# Patient Record
Sex: Female | Born: 1965 | Race: Asian | Hispanic: No | Marital: Married | Smoking: Never smoker
Health system: Southern US, Community
[De-identification: ages and names within clinical notes are randomized; demographics above are authoritative.]

## PROBLEM LIST (undated history)

## (undated) DIAGNOSIS — F5104 Psychophysiologic insomnia: Secondary | ICD-10-CM

## (undated) DIAGNOSIS — I2510 Atherosclerotic heart disease of native coronary artery without angina pectoris: Secondary | ICD-10-CM

## (undated) DIAGNOSIS — R7303 Prediabetes: Secondary | ICD-10-CM

## (undated) DIAGNOSIS — K30 Functional dyspepsia: Secondary | ICD-10-CM

## (undated) DIAGNOSIS — E785 Hyperlipidemia, unspecified: Secondary | ICD-10-CM

## (undated) DIAGNOSIS — K219 Gastro-esophageal reflux disease without esophagitis: Secondary | ICD-10-CM

## (undated) DIAGNOSIS — Z860101 Personal history of adenomatous and serrated colon polyps: Secondary | ICD-10-CM

## (undated) HISTORY — DX: Functional dyspepsia: K30

## (undated) HISTORY — DX: Psychophysiologic insomnia: F51.04

## (undated) HISTORY — DX: Personal history of adenomatous and serrated colon polyps: Z86.0101

## (undated) HISTORY — DX: Hyperlipidemia, unspecified: E78.5

## (undated) HISTORY — PX: TUBAL LIGATION: SHX77

## (undated) HISTORY — DX: Prediabetes: R73.03

## (undated) HISTORY — DX: Gastro-esophageal reflux disease without esophagitis: K21.9

---

## 2013-03-07 ENCOUNTER — Encounter (HOSPITAL_COMMUNITY): Payer: Self-pay | Admitting: Emergency Medicine

## 2013-03-07 ENCOUNTER — Emergency Department (HOSPITAL_COMMUNITY)
Admission: EM | Admit: 2013-03-07 | Discharge: 2013-03-07 | Disposition: A | Discharge status: Home / Self Care | Payer: No Typology Code available for payment source | Attending: Emergency Medicine | Admitting: Emergency Medicine

## 2013-03-07 DIAGNOSIS — Y9389 Activity, other specified: Secondary | ICD-10-CM | POA: Insufficient documentation

## 2013-03-07 DIAGNOSIS — S069X9A Unspecified intracranial injury with loss of consciousness of unspecified duration, initial encounter: Secondary | ICD-10-CM | POA: Insufficient documentation

## 2013-03-07 DIAGNOSIS — Y92410 Unspecified street and highway as the place of occurrence of the external cause: Secondary | ICD-10-CM | POA: Insufficient documentation

## 2013-03-07 DIAGNOSIS — S069XAA Unspecified intracranial injury with loss of consciousness status unknown, initial encounter: Secondary | ICD-10-CM | POA: Insufficient documentation

## 2013-03-07 DIAGNOSIS — I2510 Atherosclerotic heart disease of native coronary artery without angina pectoris: Secondary | ICD-10-CM | POA: Insufficient documentation

## 2013-03-07 HISTORY — DX: Atherosclerotic heart disease of native coronary artery without angina pectoris: I25.10

## 2013-03-07 MED ORDER — IBUPROFEN 600 MG PO TABS: 600.0000 mg | ORAL_TABLET | Freq: Four times a day (QID) | ORAL | Status: DC | PRN

## 2013-03-07 NOTE — ED Notes (Signed)
PA at bedside, language line interpreting.

## 2013-03-07 NOTE — Discharge Instructions (Signed)
Call for a follow up appointment with a Family or Primary Care Provider.  °Return if Symptoms worsen.   °Take medication as prescribed.  ° °
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
## 2013-03-11 NOTE — ED Provider Notes (Signed)
Medical screening examination/treatment/procedure(s) were performed by non-physician practitioner and as supervising physician I was immediately available for consultation/collaboration.  EKG Interpretation   None         Raygen Dahm L Vandy Tsuchiya, MD 03/11/13 0720 

## 2014-03-15 ENCOUNTER — Ambulatory Visit: Payer: Self-pay | Attending: Internal Medicine

## 2014-06-12 ENCOUNTER — Encounter (HOSPITAL_COMMUNITY): Payer: Self-pay | Admitting: *Deleted

## 2014-06-12 ENCOUNTER — Emergency Department (INDEPENDENT_AMBULATORY_CARE_PROVIDER_SITE_OTHER): Payer: No Typology Code available for payment source

## 2014-06-12 ENCOUNTER — Emergency Department (INDEPENDENT_AMBULATORY_CARE_PROVIDER_SITE_OTHER)
Admission: EM | Admit: 2014-06-12 | Discharge: 2014-06-12 | Disposition: A | Discharge status: Home / Self Care | Payer: No Typology Code available for payment source | Source: Home / Self Care | Attending: Family Medicine | Admitting: Family Medicine

## 2014-06-12 DIAGNOSIS — R1032 Left lower quadrant pain: Secondary | ICD-10-CM

## 2014-06-12 LAB — POCT URINALYSIS DIP (DEVICE)
Bilirubin Urine: NEGATIVE
Glucose, UA: NEGATIVE mg/dL
KETONES UR: NEGATIVE mg/dL
LEUKOCYTES UA: NEGATIVE
Nitrite: NEGATIVE
PH: 7 (ref 5.0–8.0)
Protein, ur: NEGATIVE mg/dL
SPECIFIC GRAVITY, URINE: 1.015 (ref 1.005–1.030)
Urobilinogen, UA: 0.2 mg/dL (ref 0.0–1.0)

## 2014-06-12 LAB — POCT PREGNANCY, URINE: PREG TEST UR: NEGATIVE

## 2014-06-12 MED ORDER — GI COCKTAIL ~~LOC~~
ORAL | Status: AC
Start: 2014-06-12 — End: 2014-06-12
  Filled 2014-06-12: qty 30

## 2014-06-12 MED ORDER — OMEPRAZOLE 40 MG PO CPDR
40.0000 mg | DELAYED_RELEASE_CAPSULE | Freq: Every day | ORAL | Status: DC
Start: 2014-06-12 — End: 2014-12-26

## 2014-06-12 MED ORDER — GI COCKTAIL ~~LOC~~
30.0000 mL | Freq: Once | ORAL | Status: AC
  Administered 2014-06-12: 30 mL via ORAL

## 2014-06-12 NOTE — Discharge Instructions (Signed)
Thank you for coming in today. If your belly pain worsens, or you have high fever, bad vomiting, blood in your stool or black tarry stool go to the Emergency Room.   Gastritis, Adult Gastritis is soreness and puffiness (inflammation) of the lining of the stomach. If you do not get help, gastritis can cause bleeding and sores (ulcers) in the stomach. HOME CARE   Only take medicine as told by your doctor.  If you were given antibiotic medicines, take them as told. Finish the medicines even if you start to feel better.  Drink enough fluids to keep your pee (urine) clear or pale yellow.  Avoid foods and drinks that make your problems worse. Foods you may want to avoid include:  Caffeine or alcohol.  Chocolate.  Mint.  Garlic and onions.  Spicy foods.  Citrus fruits, including oranges, lemons, or limes.  Food containing tomatoes, including sauce, chili, salsa, and pizza.  Fried and fatty foods.  Eat small meals throughout the day instead of large meals. GET HELP RIGHT AWAY IF:   You have black or dark red poop (stools).  You throw up (vomit) blood. It may look like coffee grounds.  You cannot keep fluids down.  Your belly (abdominal) pain gets worse.  You have a fever.  You do not feel better after 1 week.  You have any other questions or concerns. MAKE SURE YOU:   Understand these instructions.  Will watch your condition.  Will get help right away if you are not doing well or get worse. Document Released: 06/27/2007 Document Revised: 04/02/2011 Document Reviewed: 02/21/2011 [facility] Patient Information 2015 [place], [place]. This information is not intended to replace advice given to you by your health care provider. Make sure you discuss any questions you have with your health care provider.

## 2014-06-12 NOTE — ED Provider Notes (Signed)
Julia Watts is a 49 y.o. female who presents to Urgent Care today for abdominal pain. Patient has 2 day history of abdominal pain. Pain is located in the left side of the abdomen and seems to be worse with food. No fevers nausea vomiting diarrhea or vaginal bleeding or vaginal discharge. She has a bowel movement daily. No chest pains or palpitations. She has not tried any medications yet. She has a history of gastritis in the past.   History reviewed. No pertinent past medical history. History reviewed. No pertinent past surgical history. History  Substance Use Topics  . Smoking status: Not on file  . Smokeless tobacco: Not on file  . Alcohol Use: Not on file   ROS as above Medications: No current facility-administered medications for this encounter.   Current Outpatient Prescriptions  Medication Sig Dispense Refill  . omeprazole (PRILOSEC) 40 MG capsule Take 1 capsule (40 mg total) by mouth daily. 30 capsule 1   Allergies not on file   Exam:  BP 130/113 mmHg  Pulse 74  Temp(Src) 98.1 F (36.7 C) (Oral)  Resp 18  SpO2 100%  LMP  (LMP Unknown) Gen: Well NAD HEENT: EOMI,  MMM Lungs: Normal work of breathing. CTABL Heart: RRR no MRG Abd: NABS, Soft. Nondistended, mildly tender left abdomen without rebound or guarding. Exts: Brisk capillary refill, warm and well perfused.   Patient was given a GI cocktail and felt completely better.  Results for orders placed or performed during the hospital encounter of 06/12/14 (from the past 24 hour(s))  Pregnancy, urine POC     Status: None   Collection Time: 06/12/14  4:39 PM  Result Value Ref Range   Preg Test, Ur NEGATIVE NEGATIVE  POCT urinalysis dip (device)     Status: Abnormal   Collection Time: 06/12/14  4:40 PM  Result Value Ref Range   Glucose, UA NEGATIVE NEGATIVE mg/dL   Bilirubin Urine NEGATIVE NEGATIVE   Ketones, ur NEGATIVE NEGATIVE mg/dL   Specific Gravity, Urine 1.015 1.005 - 1.030   Hgb urine dipstick TRACE (A)  NEGATIVE   pH 7.0 5.0 - 8.0   Protein, ur NEGATIVE NEGATIVE mg/dL   Urobilinogen, UA 0.2 0.0 - 1.0 mg/dL   Nitrite NEGATIVE NEGATIVE   Leukocytes, UA NEGATIVE NEGATIVE   Dg Abd 1 View  06/12/2014   CLINICAL DATA:  Acute onset of left-sided abdominal pain for 2 days. Initial encounter.  EXAM: ABDOMEN - 1 VIEW  COMPARISON:  None.  FINDINGS: The visualized bowel gas pattern is unremarkable. Scattered air and stool filled loops of colon are seen; no abnormal dilatation of small bowel loops is seen to suggest small bowel obstruction. No free intra-abdominal air is identified, though evaluation for free air is limited on a single supine view.  The visualized osseous structures are within normal limits; the sacroiliac joints are unremarkable in appearance.  IMPRESSION: Unremarkable bowel gas pattern; no free intra-abdominal air seen. Small to moderate amount of stool noted in the colon.   Electronically Signed   By: Garald Balding M.D.   On: 06/12/2014 17:32    Assessment and Plan: 49 y.o. female with abdominal pain likely gastritis based on history of previous episodes of gastritis and improvement with GI cocktail. Treat with omeprazole and follow-up with primary care provider. Return as needed.  Discussed warning signs or symptoms. Please see discharge instructions. Patient expresses understanding.     Gregor Hams, MD 06/12/14 [phone]

## 2014-06-12 NOTE — ED Notes (Signed)
Pt  Reports  Low  abd  Pain     X   Several  Days          No     Diarrhea            Symptoms        Any bleeding   denys  Any  Discharge           History    Of     Gastritis               Husband in  Interpreting

## 2014-09-09 ENCOUNTER — Emergency Department (INDEPENDENT_AMBULATORY_CARE_PROVIDER_SITE_OTHER)
Admission: EM | Admit: 2014-09-09 | Discharge: 2014-09-09 | Disposition: A | Discharge status: Home / Self Care | Payer: Self-pay | Source: Home / Self Care | Attending: Family Medicine | Admitting: Family Medicine

## 2014-09-09 ENCOUNTER — Encounter (HOSPITAL_COMMUNITY): Payer: Self-pay | Admitting: Emergency Medicine

## 2014-09-09 DIAGNOSIS — H527 Unspecified disorder of refraction: Secondary | ICD-10-CM

## 2014-09-09 DIAGNOSIS — H538 Other visual disturbances: Secondary | ICD-10-CM

## 2014-09-09 NOTE — ED Notes (Signed)
Here for vision change States she is not able to see things near States she would like a pair of glasses

## 2014-09-09 NOTE — ED Provider Notes (Signed)
CSN: [id number]     Arrival date & time 09/09/14  1714 History   First MD Initiated Contact with Patient 09/09/14 1827     Chief Complaint  Patient presents with  . Visual Field Change   (Consider location/radiation/quality/duration/timing/severity/associated sxs/prior Treatment) HPI Comments: 49 year old Asian female complaining of blurred vision for 2-3 months. She states her vision has been deteriorating gradually over time. It is difficult for her to see both faraway and up close for reading. Her visual acuity test was 20/50 in each eye and both eyes. She denies eye pain. Denies eye injury.   Past Medical History  Diagnosis Date  . Coronary artery disease    History reviewed. No pertinent past surgical history. History reviewed. No pertinent family history. Social History  Substance Use Topics  . Smoking status: Never Smoker   . Smokeless tobacco: None  . Alcohol Use: None   OB History    No data available     Review of Systems  Constitutional: Negative.   HENT: Negative.   Eyes: Positive for visual disturbance. Negative for photophobia, pain, discharge, redness and itching.    Allergies  Review of patient's allergies indicates no known allergies.  Home Medications   Prior to Admission medications   Medication Sig Start Date End Date Taking? Authorizing Provider  ibuprofen (ADVIL,MOTRIN) 600 MG tablet Take 1 tablet (600 mg total) by mouth every 6 (six) hours as needed. Take with meals 03/07/13   Harvie Heck, PA-C   BP 110/89 mmHg  Pulse 72  Temp(Src) 97.4 F (36.3 C) (Oral)  Resp 16  SpO2 99% Physical Exam  Constitutional: She is oriented to person, place, and time. She appears well-developed and well-nourished. No distress.  HENT:  Head: Normocephalic and atraumatic.  Mouth/Throat: Oropharynx is clear and moist. No oropharyngeal exudate.  Eyes: Conjunctivae and EOM are normal. Pupils are equal, round, and reactive to light.  Funduscopic exam reveals sharp  disks. No hemorrhages. No visual field deficits  Neck: Normal range of motion. Neck supple.  Cardiovascular: Normal rate.   Pulmonary/Chest: No respiratory distress.  Neurological: She is alert and oriented to person, place, and time. She exhibits normal muscle tone.  Skin: Skin is warm and dry.  Psychiatric: She has a normal mood and affect.  Nursing note and vitals reviewed.   ED Course  Procedures (including critical care time) Labs Review Labs Reviewed - No data to display  Imaging Review No results found.   MDM   1. Blurred vision, bilateral   2. Refraction error    Follow with optometrist of choice    Janne Napoleon, NP 09/09/14 [phone]

## 2014-09-09 NOTE — Discharge Instructions (Signed)
Blurred Vision You have been seen today complaining of blurred vision. This means you have a loss of ability to see small details.  CAUSES  Blurred vision can be a symptom of underlying eye problems, such as:  Aging of the eye (presbyopia).  Glaucoma.  Cataracts.  Eye infection.  Eye-related migraine.  Diabetes mellitus.  Fatigue.  Migraine headaches.  High blood pressure.  Breakdown of the back of the eye (macular degeneration).  Problems caused by some medications. The most common cause of blurred vision is the need for eyeglasses or a new prescription. Today in the emergency department, no cause for your blurred vision can be found. SYMPTOMS  Blurred vision is the loss of visual sharpness and detail (acuity). DIAGNOSIS  Should blurred vision continue, you should see your caregiver. If your caregiver is your primary care physician, he or she may choose to refer you to another specialist.  TREATMENT  Do not ignore your blurred vision. Make sure to have it checked out to see if further treatment or referral is necessary. SEEK MEDICAL CARE IF:  You are unable to get into a specialist so we can help you with a referral. SEEK IMMEDIATE MEDICAL CARE IF: You have severe eye pain, severe headache, or sudden loss of vision. MAKE SURE YOU:   Understand these instructions.  Will watch your condition.  Will get help right away if you are not doing well or get worse. Document Released: 01/11/2003 Document Revised: 04/02/2011 Document Reviewed: 08/13/2007 [facility] Patient Information 2015 [place], [place]. This information is not intended to replace advice given to you by your health care provider. Make sure you discuss any questions you have with your health care provider.  Visual Disturbances You have had a disturbance in your vision. This may be caused by various conditions, such as:  Migraines. Migraine headaches are often preceded by a disturbance in vision. Blind spots or  light flashes are followed by a headache. This type of visual disturbance is temporary. It does not damage the eye.  Glaucoma. This is caused by increased pressure in the eye. Symptoms include haziness, blurred vision, or seeing rainbow colored circles when looking at bright lights. Partial or complete visual loss can occur. You may or may not experience eye pain. Visual loss may be gradual or sudden and is irreversible. Glaucoma is the leading cause of blindness.  Retina problems. Vision will be reduced if the retina becomes detached or if there is a circulation problem as with diabetes, high blood pressure, or a mini-stroke. Symptoms include seeing "floaters," flashes of light, or shadows, as if a curtain has fallen over your eye.  Optic nerve problems. The main nerve in your eye can be damaged by redness, soreness, and swelling (inflammation), poor circulation, drugs, and toxins. It is very important to have a complete exam done by a specialist to determine the exact cause of your eye problem. The specialist may recommend medicines or surgery, depending on the cause of the problem. This can help prevent further loss of vision or reduce the risk of having a stroke. Contact the caregiver to whom you have been referred and arrange for follow-up care right away. SEEK IMMEDIATE MEDICAL CARE IF:   Your vision gets worse.  You develop severe headaches.  You have any weakness or numbness in the face, arms, or legs.  You have any trouble speaking or walking. Document Released: 02/16/2004 Document Revised: 04/02/2011 Document Reviewed: 06/08/2009 [facility] Patient Information 2015 [place], [place]. This information is not intended to  replace advice given to you by your health care provider. Make sure you discuss any questions you have with your health care provider. ° °

## 2014-10-19 ENCOUNTER — Ambulatory Visit: Payer: Self-pay | Attending: Internal Medicine

## 2014-10-20 ENCOUNTER — Encounter (HOSPITAL_COMMUNITY): Payer: Self-pay | Admitting: Emergency Medicine

## 2014-12-26 ENCOUNTER — Emergency Department (INDEPENDENT_AMBULATORY_CARE_PROVIDER_SITE_OTHER)
Admission: EM | Admit: 2014-12-26 | Discharge: 2014-12-26 | Disposition: A | Discharge status: Home / Self Care | Payer: No Typology Code available for payment source | Source: Home / Self Care

## 2014-12-26 ENCOUNTER — Encounter (HOSPITAL_COMMUNITY): Payer: Self-pay | Admitting: Emergency Medicine

## 2014-12-26 DIAGNOSIS — R1111 Vomiting without nausea: Secondary | ICD-10-CM

## 2014-12-26 LAB — POCT URINALYSIS DIP (DEVICE)
Bilirubin Urine: NEGATIVE
Glucose, UA: NEGATIVE mg/dL
KETONES UR: NEGATIVE mg/dL
LEUKOCYTES UA: NEGATIVE
NITRITE: NEGATIVE
PH: 6.5 (ref 5.0–8.0)
Protein, ur: NEGATIVE mg/dL
Specific Gravity, Urine: <=1.005 (ref 1.005–1.030)
Urobilinogen, UA: 0.2 mg/dL (ref 0.0–1.0)

## 2014-12-26 MED ORDER — OMEPRAZOLE 40 MG PO CPDR: 40.0000 mg | DELAYED_RELEASE_CAPSULE | Freq: Every day | ORAL | Status: DC

## 2014-12-26 NOTE — ED Provider Notes (Signed)
CSN: [id number]     Arrival date & time 12/26/14  1300 History   None    Chief Complaint  Patient presents with  . Emesis   (Consider location/radiation/quality/duration/timing/severity/associated sxs/prior Treatment) HPI 49 year old female with history of vomiting daily for the last 2 months. States that she vomits whenever she eats or drinks. She states that she has been seen in the urgent care several months ago for this similar problem and was given Prilosec which did help her symptoms but she is out of that medication at this time. She states that she does not have insurance to go to a doctor thus she comes here. States she has a bowel movement daily. She has not had any weight loss. No weakness. She denies pregnancy. There is been no change in sexual partners. (Husband is in the room when she answers this) Past Medical History  Diagnosis Date  . Coronary artery disease    History reviewed. No pertinent past surgical history. No family history on file. Social History  Substance Use Topics  . Smoking status: Never Smoker   . Smokeless tobacco: None  . Alcohol Use: No   OB History    Gravida Para Term Preterm AB TAB SAB Ectopic Multiple Living   0 0 0 0 0 0 0 0       Review of Systems Positive for vomiting Denies fever, chills, blood in stool. Allergies  Review of patient's allergies indicates no known allergies.  Home Medications   Prior to Admission medications   Medication Sig Start Date End Date Taking? Authorizing Provider  ibuprofen (ADVIL,MOTRIN) 600 MG tablet Take 1 tablet (600 mg total) by mouth every 6 (six) hours as needed. Take with meals 03/07/13   Harvie Heck, PA-C  omeprazole (PRILOSEC) 40 MG capsule Take 1 capsule (40 mg total) by mouth daily. 12/26/14   Konrad Felix, PA   Meds Ordered and Administered this Visit  Medications - No data to display  LMP  (LMP Unknown) Orthostatic VS for the past 24 hrs:  BP- Lying Pulse- Lying BP- Sitting Pulse-  Sitting  12/26/14 1331 122/78 mmHg 65 108/79 mmHg 62    Physical Exam  Constitutional: She is oriented to person, place, and time. She appears well-developed and well-nourished.  Neck: Normal range of motion. Neck supple.  Cardiovascular: Normal rate and normal heart sounds.   Pulmonary/Chest: Effort normal and breath sounds normal.  Abdominal: Soft. Bowel sounds are normal. She exhibits no distension and no mass. There is no tenderness. There is no rebound and no guarding.  Musculoskeletal: Normal range of motion.  Neurological: She is alert and oriented to person, place, and time.  Skin: Skin is warm and dry.  Patient has good skin turgor  Nursing note and vitals reviewed.   ED Course  Procedures (including critical care time)  Labs Review Labs Reviewed  POCT URINALYSIS DIP (DEVICE) - Abnormal; Notable for the following:    Hgb urine dipstick TRACE (*)    All other components within normal limits    Imaging Review No results found.   Visual Acuity Review  Right Eye Distance:   Left Eye Distance:   Bilateral Distance:    Right Eye Near:   Left Eye Near:    Bilateral Near:         MDM   1. Non-intractable vomiting without nausea, vomiting of unspecified type    there is no indication for emergent investigations at this time. Patient does request a new prescription for  Prilosec which has been given to her she is also asked for a oral gastric enterology. Paperwork for [employer] [facility] services has been provided to her. She is advised to call and set up an appointment. I have advised patient that I find nothing that is untoward at this time is not losing weight, and is making stool. She is advised to return if there are new or worsening of symptoms. She is also advised that long-term vomiting tenderness probably beyond the ability of the urgent care and therefore she really needs to see a specialist.    Konrad Felix, [place] 12/26/14 [phone]

## 2014-12-26 NOTE — Discharge Instructions (Signed)
Nausea and Vomiting  Nausea means you feel sick to your stomach. Throwing up (vomiting) is a reflex where stomach contents come out of your mouth.  HOME CARE   · Take medicine as told by your doctor.  · Do not force yourself to eat. However, you do need to drink fluids.  · If you feel like eating, eat a normal diet as told by your doctor.    Eat rice, wheat, potatoes, bread, lean meats, yogurt, fruits, and vegetables.    Avoid high-fat foods.  · Drink enough fluids to keep your pee (urine) clear or pale yellow.  · Ask your doctor how to replace body fluid losses (rehydrate). Signs of body fluid loss (dehydration) include:    Feeling very thirsty.    Dry lips and mouth.    Feeling dizzy.    Dark pee.    Peeing less than normal.    Feeling confused.    Fast breathing or heart rate.  GET HELP RIGHT AWAY IF:   · You have blood in your throw up.  · You have black or bloody poop (stool).  · You have a bad headache or stiff neck.  · You feel confused.  · You have bad belly (abdominal) pain.  · You have chest pain or trouble breathing.  · You do not pee at least once every 8 hours.  · You have cold, clammy skin.  · You keep throwing up after 24 to 48 hours.  · You have a fever.  MAKE SURE YOU:   · Understand these instructions.  · Will watch your condition.  · Will get help right away if you are not doing well or get worse.     This information is not intended to replace advice given to you by your health care provider. Make sure you discuss any questions you have with your health care provider.     Document Released: 06/27/2007 Document Revised: 04/02/2011 Document Reviewed: 06/09/2010  Elsevier Interactive Patient Education ©2016 [employer]

## 2014-12-26 NOTE — ED Notes (Signed)
Pt here with constant emesis with food/fluids x 2 months Pt was here for same sx;s and prescribed anti-acid without relief Left LQ pain, non radiating  Orthostats negative

## 2015-08-15 ENCOUNTER — Ambulatory Visit (HOSPITAL_COMMUNITY)
Admission: RE | Admit: 2015-08-15 | Discharge: 2015-08-15 | Disposition: A | Discharge status: Home / Self Care | Payer: No Typology Code available for payment source | Source: Ambulatory Visit | Attending: Family Medicine | Admitting: Family Medicine

## 2015-08-15 ENCOUNTER — Encounter: Payer: Self-pay | Admitting: Family Medicine

## 2015-08-15 ENCOUNTER — Ambulatory Visit (INDEPENDENT_AMBULATORY_CARE_PROVIDER_SITE_OTHER): Payer: No Typology Code available for payment source | Admitting: Family Medicine

## 2015-08-15 VITALS — BP 81/55 | HR 57 | Temp 97.7°F | Ht 61.0 in | Wt 124.0 lb

## 2015-08-15 DIAGNOSIS — Z0000 Encounter for general adult medical examination without abnormal findings: Secondary | ICD-10-CM

## 2015-08-15 DIAGNOSIS — M25572 Pain in left ankle and joints of left foot: Secondary | ICD-10-CM

## 2015-08-15 LAB — COMPLETE METABOLIC PANEL WITH GFR
ALBUMIN: 4.6 g/dL (ref 3.6–5.1)
ALK PHOS: 60 U/L (ref 33–130)
ALT: 15 U/L (ref 6–29)
AST: 21 U/L (ref 10–35)
BUN: 12 mg/dL (ref 7–25)
CALCIUM: 9.5 mg/dL (ref 8.6–10.4)
CO2: 27 mmol/L (ref 20–31)
Chloride: 103 mmol/L (ref 98–110)
Creat: 0.71 mg/dL (ref 0.50–1.05)
GFR, Est Non African American: >89 mL/min (ref >=60)
Glucose, Bld: 71 mg/dL (ref 65–99)
POTASSIUM: 4 mmol/L (ref 3.5–5.3)
Sodium: 138 mmol/L (ref 135–146)
Total Bilirubin: 1 mg/dL (ref 0.2–1.2)
Total Protein: 7.7 g/dL (ref 6.1–8.1)

## 2015-08-15 LAB — CBC WITH DIFFERENTIAL/PLATELET
BASOS PCT: 0 %
Basophils Absolute: 0 cells/uL (ref 0–200)
EOS PCT: 1 %
Eosinophils Absolute: 75 cells/uL (ref 15–500)
HCT: 41.2 % (ref 35.0–45.0)
Hemoglobin: 13.7 g/dL (ref 11.7–15.5)
LYMPHS ABS: 2025 {cells}/uL (ref 850–3900)
LYMPHS PCT: 27 %
MCH: 29.8 pg (ref 27.0–33.0)
MCHC: 33.3 g/dL (ref 32.0–36.0)
MCV: 89.8 fL (ref 80.0–100.0)
MPV: 10.8 fL (ref 7.5–12.5)
Monocytes Absolute: 600 cells/uL (ref 200–950)
Monocytes Relative: 8 %
Neutro Abs: 4800 cells/uL (ref 1500–7800)
Neutrophils Relative %: 64 %
Platelets: 261 10*3/uL (ref 140–400)
RBC: 4.59 MIL/uL (ref 3.80–5.10)
RDW: 15.1 % — AB (ref 11.0–15.0)
WBC: 7.5 10*3/uL (ref 3.8–10.8)

## 2015-08-15 LAB — LIPID PANEL
CHOLESTEROL: 208 mg/dL — AB (ref 125–200)
HDL: 61 mg/dL (ref >=46)
LDL Cholesterol: 117 mg/dL (ref <130)
TRIGLYCERIDES: 151 mg/dL — AB (ref <150)
Total CHOL/HDL Ratio: 3.4 Ratio (ref <=5.0)
VLDL: 30 mg/dL (ref <30)

## 2015-08-15 NOTE — Patient Instructions (Signed)
Go to x-ray at [facility] for an x-ray of your ankle. We will let you know if there is anything in your labs that needs attention.

## 2015-08-16 NOTE — Progress Notes (Signed)
Julia Watts, is a 50 y.o. female  JE:[id number]  [id number]  DOB - 1965-12-23  CC:  Chief Complaint  Patient presents with  . New Patient (Initial Visit)    with problem of left foot/ankle pain for years, can not walk very far, has not had any surgery, pain is a 7 today, when she walks pain gets worse        HPI: Julia Watts is a 50 y.o. female here to establish care. Her only complaint is of chronic left ankle and foot pain for a number of years.She is unable of any particular injury. Her past medical history list CAD but she and her husband deny this. She is currently on no chronic medications.   No Known Allergies Past Medical History:  Diagnosis Date  . Coronary artery disease    Current Outpatient Prescriptions on File Prior to Visit  Medication Sig Dispense Refill  . ibuprofen (ADVIL,MOTRIN) 600 MG tablet Take 1 tablet (600 mg total) by mouth every 6 (six) hours as needed. Take with meals (Patient not taking: Reported on 08/15/2015) 30 tablet 0  . omeprazole (PRILOSEC) 40 MG capsule Take 1 capsule (40 mg total) by mouth daily. (Patient not taking: Reported on 08/15/2015) 30 capsule 1   No current facility-administered medications on file prior to visit.    History reviewed. No pertinent family history. Social History   Social History  . Marital status: Married    Spouse name: N/A  . Number of children: N/A  . Years of education: N/A   Occupational History  . Not on file.   Social History Main Topics  . Smoking status: Never Smoker  . Smokeless tobacco: Never Used  . Alcohol use No  . Drug use: No  . Sexual activity: Not on file   Other Topics Concern  . Not on file   Social History Narrative   ** Merged History Encounter **        Review of Systems: Constitutional: Negative for fever, chills, appetite change, weight loss,  Fatigue. Skin: Negative for rashes or lesions of concern. HENT: Negative for ear pain, ear discharge.nose bleeds Eyes: Negative  for pain, discharge, redness, itching and visual disturbance. Neck: Negative for pain, stiffness Respiratory: Negative for cough, shortness of breath,   Cardiovascular: Negative for chest pain, palpitations and leg swelling. Gastrointestinal: Negative for abdominal pain, nausea, vomiting, diarrhea, constipations Genitourinary: Negative for dysuria, urgency, frequency, hematuria,  Musculoskeletal: Negative for back pain, joint pain, joint  swelling, and gait problem.Negative for weakness.Positive for ankle and foot pain Neurological: Negative for dizziness, tremors, seizures, syncope,   light-headedness, numbness and headaches.  Hematological: Negative for easy bruising or bleeding Psychiatric/Behavioral: Negative for depression, anxiety, decreased concentration, confusion   Objective:   Vitals:   08/15/15 0856  BP: (!) 81/55  Pulse: (!) 57  Temp: 97.7 F (36.5 C)    Physical Exam: Constitutional: Patient appears well-developed and well-nourished. No distress. HENT: Normocephalic, atraumatic, External right and left ear normal. Oropharynx is clear and moist.  Eyes: Conjunctivae and EOM are normal. PERRLA, no scleral icterus. Neck: Normal ROM. Neck supple. No lymphadenopathy, No thyromegaly. CVS: RRR, S1/S2 +, no murmurs, no gallops, no rubs Pulmonary: Effort and breath sounds normal, no stridor, rhonchi, wheezes, rales.  Abdominal: Soft. Normoactive BS,, no distension, tenderness, rebound or guarding.  Musculoskeletal: Normal range of motion. No edema and no tenderness.  Neuro: Alert.Normal muscle tone coordination. Non-focal Skin: Skin is warm and dry. No rash noted. Not diaphoretic. No erythema.  No pallor. Psychiatric: Normal mood and affect. Behavior, judgment, thought content normal.  Lab Results  Component Value Date   WBC 7.5 08/15/2015   HGB 13.7 08/15/2015   HCT 41.2 08/15/2015   MCV 89.8 08/15/2015   PLT 261 08/15/2015   Lab Results  Component Value Date    CREATININE 0.71 08/15/2015   BUN 12 08/15/2015   NA 138 08/15/2015   K 4.0 08/15/2015   CL 103 08/15/2015   CO2 27 08/15/2015    No results found for: HGBA1C Lipid Panel     Component Value Date/Time   CHOL 208 (H) 08/15/2015 0953   TRIG 151 (H) 08/15/2015 0953   HDL 61 08/15/2015 0953   CHOLHDL 3.4 08/15/2015 0953   VLDL 30 08/15/2015 0953   LDLCALC 117 08/15/2015 0953       Assessment and plan:   1. Healthcare maintenance  - COMPLETE METABOLIC PANEL WITH GFR - CBC with Differential - Lipid panel  2. Pain in joint, ankle and foot, left  - DG Ankle Complete Left; Future   Return in about 3 months (around 11/15/2015).  The patient was given clear instructions to go to ER or return to medical center if symptoms don't improve, worsen or new problems develop. The patient verbalized understanding.    Micheline Chapman FNP  08/16/2015, 10:11 AM

## 2015-11-21 ENCOUNTER — Ambulatory Visit: Payer: No Typology Code available for payment source | Admitting: Family Medicine

## 2017-10-06 ENCOUNTER — Emergency Department (HOSPITAL_COMMUNITY): Payer: Self-pay

## 2017-10-06 ENCOUNTER — Other Ambulatory Visit: Payer: Self-pay

## 2017-10-06 ENCOUNTER — Emergency Department (HOSPITAL_COMMUNITY)
Admission: EM | Admit: 2017-10-06 | Discharge: 2017-10-06 | Disposition: A | Discharge status: Home / Self Care | Payer: Self-pay | Attending: Emergency Medicine | Admitting: Emergency Medicine

## 2017-10-06 ENCOUNTER — Encounter (HOSPITAL_COMMUNITY): Payer: Self-pay

## 2017-10-06 DIAGNOSIS — K5900 Constipation, unspecified: Secondary | ICD-10-CM | POA: Insufficient documentation

## 2017-10-06 DIAGNOSIS — R109 Unspecified abdominal pain: Secondary | ICD-10-CM | POA: Insufficient documentation

## 2017-10-06 DIAGNOSIS — I2510 Atherosclerotic heart disease of native coronary artery without angina pectoris: Secondary | ICD-10-CM | POA: Insufficient documentation

## 2017-10-06 LAB — URINALYSIS, ROUTINE W REFLEX MICROSCOPIC
Bacteria, UA: NONE SEEN
Bilirubin Urine: NEGATIVE
Glucose, UA: NEGATIVE mg/dL
KETONES UR: NEGATIVE mg/dL
Leukocytes, UA: NEGATIVE
Nitrite: NEGATIVE
PH: 6 (ref 5.0–8.0)
Protein, ur: NEGATIVE mg/dL
Specific Gravity, Urine: 1.002 — ABNORMAL LOW (ref 1.005–1.030)

## 2017-10-06 LAB — CBC
HCT: 41.2 % (ref 36.0–46.0)
HEMOGLOBIN: 13.2 g/dL (ref 12.0–15.0)
MCH: 29 pg (ref 26.0–34.0)
MCHC: 32 g/dL (ref 30.0–36.0)
MCV: 90.5 fL (ref 78.0–100.0)
Platelets: 279 10*3/uL (ref 150–400)
RBC: 4.55 MIL/uL (ref 3.87–5.11)
RDW: 13 % (ref 11.5–15.5)
WBC: 5.8 10*3/uL (ref 4.0–10.5)

## 2017-10-06 LAB — COMPREHENSIVE METABOLIC PANEL
ALT: 20 U/L (ref 0–44)
ANION GAP: 6 (ref 5–15)
AST: 23 U/L (ref 15–41)
Albumin: 3.7 g/dL (ref 3.5–5.0)
Alkaline Phosphatase: 90 U/L (ref 38–126)
BUN: 7 mg/dL (ref 6–20)
CALCIUM: 9 mg/dL (ref 8.9–10.3)
CO2: 28 mmol/L (ref 22–32)
CREATININE: 0.83 mg/dL (ref 0.44–1.00)
Chloride: 105 mmol/L (ref 98–111)
Glucose, Bld: 94 mg/dL (ref 70–99)
Potassium: 4 mmol/L (ref 3.5–5.1)
SODIUM: 139 mmol/L (ref 135–145)
Total Bilirubin: 0.7 mg/dL (ref 0.3–1.2)
Total Protein: 7.7 g/dL (ref 6.5–8.1)

## 2017-10-06 LAB — LIPASE, BLOOD: Lipase: 31 U/L (ref 11–51)

## 2017-10-06 MED ORDER — GI COCKTAIL ~~LOC~~
30.0000 mL | Freq: Once | ORAL | Status: AC
  Administered 2017-10-06: 30 mL via ORAL
  Filled 2017-10-06: qty 30

## 2017-10-06 MED ORDER — POLYETHYLENE GLYCOL 3350 17 G PO PACK: 17.0000 g | PACK | Freq: Every day | ORAL | 0 refills | Status: DC | PRN

## 2017-10-06 MED ORDER — ALUMINUM-MAGNESIUM-SIMETHICONE 200-200-20 MG/5ML PO SUSP: 15.0000 mL | ORAL | 0 refills | Status: DC | PRN

## 2017-10-06 MED ORDER — IOHEXOL 300 MG/ML  SOLN
100.0000 mL | Freq: Once | INTRAMUSCULAR | Status: AC | PRN
  Administered 2017-10-06: 100 mL via INTRAVENOUS

## 2017-10-06 NOTE — ED Notes (Signed)
Interpreting machine in room with pt

## 2017-10-06 NOTE — ED Triage Notes (Signed)
Patient complains of stomach with burning/hot feeling. Has been ongoing intermittently for days, no associated symptoms. Has been using prescribed meds with minimal relief. Denies dysuria. Using prilosec and zantac.

## 2017-10-06 NOTE — ED Notes (Signed)
Patient transported to CT 

## 2017-10-06 NOTE — ED Provider Notes (Signed)
[facility] EMERGENCY DEPARTMENT Provider Note   CSN: [id number] Arrival date & time: 10/06/17  1549     History   Chief Complaint Chief Complaint  Patient presents with  . Abdominal Pain    HPI Julia Watts is a 52 y.o. female presenting for evaluation of abdominal pain.  History obtained with help of [place] interpreter, as patient speaks Burmese.  Patient presenting for evaluation of abdominal pain.  Reports abdominal pain/burning for the past week, with increasing generalized abdominal pain.  Patient reports abnormal bowel movements, has been more constipated.  Last bowel movement was 2 days ago, it was not hard or bloody.  She denies fevers, chills, chest pain, shortness of breath, nausea, vomiting, urinary symptoms.  Burning is worse after eating.  Patient was evaluated for this at urgent care 5 days ago, prescribed ranitidine and Prilosec, she has been taking as prescribed without improvement of her symptoms.  Patient reports symptoms have been worsening.  Patient states she had similar event 2 years ago, which was treated with these medicine successfully.  She denies ibuprofen use.  She denies eating spicy foods.  She states she has no other medical problems, takes no medications daily.  History obtained from chart review.  2 years ago, patient with similar episode of abdominal burning and pain.  Thought to be reflux/PUD related.  Referred to GI, but did go.  Patient has CAD listed as past medical history, but patient and husband deny, as they have at previous visits.  HPI  Past Medical History:  Diagnosis Date  . Coronary artery disease     There are no active problems to display for this patient.   History reviewed. No pertinent surgical history.   OB History    Gravida  0   Para  0   Term  0   Preterm  0   AB  0   Living        SAB  0   TAB  0   Ectopic  0   Multiple      Live Births               Home Medications     Prior to Admission medications   Medication Sig Start Date End Date Taking? Authorizing Provider  aluminum-magnesium hydroxide-simethicone (MAALOX) 694-854-62 MG/5ML SUSP Take 15 mLs by mouth as needed. 10/06/17   Bradford Cazier, PA-C  ibuprofen (ADVIL,MOTRIN) 600 MG tablet Take 1 tablet (600 mg total) by mouth every 6 (six) hours as needed. Take with meals Patient not taking: Reported on 08/15/2015 03/07/13   Harvie Heck, PA-C  omeprazole (PRILOSEC) 40 MG capsule Take 1 capsule (40 mg total) by mouth daily. Patient not taking: Reported on 08/15/2015 12/26/14   Konrad Felix, PA  polyethylene glycol [facility]) packet Take 17 g by mouth daily as needed (constipation). 10/06/17   Yuriko Portales, PA-C    Family History No family history on file.  Social History Social History   Tobacco Use  . Smoking status: Never Smoker  . Smokeless tobacco: Never Used  Substance Use Topics  . Alcohol use: No  . Drug use: No     Allergies   Patient has no known allergies.   Review of Systems Review of Systems  Gastrointestinal: Positive for abdominal pain and constipation.  All other systems reviewed and are negative.    Physical Exam Updated Vital Signs BP 115/82   Pulse (!) 57   Temp 97.9 F (36.6  C) (Oral)   Resp 14   SpO2 100%   Physical Exam  Constitutional: She is oriented to person, place, and time. No distress.  Appears older than stated age, no acute distress  HENT:  Head: Normocephalic and atraumatic.  Eyes: Pupils are equal, round, and reactive to light. Conjunctivae and EOM are normal.  Neck: Normal range of motion. Neck supple.  Cardiovascular: Normal rate, regular rhythm and intact distal pulses.  Pulmonary/Chest: Effort normal and breath sounds normal. No respiratory distress. She has no wheezes.  Abdominal: Soft. She exhibits no distension and no mass. There is tenderness. There is no rebound and no guarding.  Tenderness palpation of the abdomen, worse in  the left lower quadrant.  No rigidity, guarding, distention.  No masses.  Negative rebound.  Musculoskeletal: Normal range of motion.  Neurological: She is alert and oriented to person, place, and time.  Skin: Skin is warm and dry. Capillary refill takes less than 2 seconds.  Psychiatric: She has a normal mood and affect.  Nursing note and vitals reviewed.    ED Treatments / Results  Labs (all labs ordered are listed, but only abnormal results are displayed) Labs Reviewed  URINALYSIS, ROUTINE W REFLEX MICROSCOPIC - Abnormal; Notable for the following components:      Result Value   Color, Urine COLORLESS (*)    Specific Gravity, Urine 1.002 (*)    Hgb urine dipstick SMALL (*)    All other components within normal limits  LIPASE, BLOOD  COMPREHENSIVE METABOLIC PANEL  CBC    EKG None  Radiology Ct Abdomen Pelvis W Contrast  Result Date: 10/06/2017 CLINICAL DATA:  Possible chronic fever. EXAM: CT ABDOMEN AND PELVIS WITH CONTRAST TECHNIQUE: Multidetector CT imaging of the abdomen and pelvis was performed using the standard protocol following bolus administration of intravenous contrast. CONTRAST:  172mL OMNIPAQUE IOHEXOL 300 MG/ML  SOLN COMPARISON:  None. FINDINGS: Lower chest: Lung bases are normal. Hepatobiliary: Liver, biliary tree and gallbladder are normal. Pancreas: Normal. Spleen: Normal. Adrenals/Urinary Tract: Adrenal glands are normal. Kidneys are normal in size without hydronephrosis or nephrolithiasis. Subcentimeter hypodensity over the upper pole left renal cortex too small to characterize but likely a cyst. Ureters and bladder are normal. Stomach/Bowel: Stomach and small bowel are within normal. Appendix is normal. Minimal diverticulosis of the colon which is otherwise unremarkable. Vascular/Lymphatic: Normal. Reproductive: Normal. Other: No free fluid or focal inflammatory change. Musculoskeletal: Unremarkable. IMPRESSION: No acute findings in the abdomen/pelvis.  Subcentimeter left renal cortical hypodensity too small to characterize but likely a cyst. Minimal colonic diverticulosis. Electronically Signed   By: Marin Olp M.D.   On: 10/06/2017 20:31    Procedures Procedures (including critical care time)  Medications Ordered in ED Medications  gi cocktail (Maalox,Lidocaine,Donnatal) (30 mLs Oral Given 10/06/17 1822)  iohexol (OMNIPAQUE) 300 MG/ML solution 100 mL (100 mLs Intravenous Contrast Given 10/06/17 2005)     Initial Impression / Assessment and Plan / ED Course  I have reviewed the triage vital signs and the nursing notes.  Pertinent labs & imaging results that were available during my care of the patient were reviewed by me and considered in my medical decision making (see chart for details).     Pt presenting for evaluation of abdominal pain.  Physical exam shows patient is afebrile not tachycardic.  Appears nontoxic.  However, increased generalized abdominal pain, despite treatment for PUD/reflux.  Has worsened left lower quadrant abdominal pain, and is having worsening constipation.  Concern for possible diverticulitis, obstruction,  or other interabdominal infection.  Labs reassuring, no leukocytosis. Kidney, liver, pancreatic function reassuring.  Urine with small blood, no obvious infection.  Will not treat for UTI.  GI cocktail given.  CT ordered.  Patient reports improvement with GI cocktail, although improvement was short-lived.  CT without acute infection, perforation, or obstruction.  No surgical abdomen.  Discussed findings with patient and husband.  Discussed continued use of ranitidine and Prilosec.  Maalox as needed for breakthrough pain.  Hydration and miralax as needed for constipation.  Discussed importance of follow-up with GI for further evaluation, including possible H. pylori testing and EGD.  At this time, patient appears safe for discharge.  Return precautions given.  Patient states she understands and agrees to  plan.  Final Clinical Impressions(s) / ED Diagnoses   Final diagnoses:  Acute abdominal pain  Constipation, unspecified constipation type    ED Discharge Orders         Ordered    aluminum-magnesium hydroxide-simethicone (MAALOX) [phone] MG/5ML SUSP  As needed     10/06/17 2105    polyethylene glycol (MIRALAX) packet  Daily PRN     10/06/17 2105           Franchot Heidelberg, PA-C 10/06/17 [place], [place], DO 10/06/17 2338

## 2017-10-06 NOTE — ED Notes (Signed)
Patient verbalizes understanding of discharge instructions. Opportunity for questioning and answers were provided. Armband removed by staff, pt discharged from ED.  

## 2017-10-06 NOTE — Discharge Instructions (Addendum)
Continue taking ranitidine and Prilosec as prescribed. Use Maalox as needed for abdominal pain or burning. Use MiraLAX as needed for constipation.  More importantly, make sure you are staying well-hydrated with water. Follow-up with the stomach doctors for further evaluation of your symptoms. Return to the emergency room if you develop high fevers, persistent vomiting, severe worsening abdominal pain, or any new or concerning symptoms.

## 2019-01-07 ENCOUNTER — Encounter: Payer: Self-pay | Admitting: Physician Assistant

## 2019-01-23 HISTORY — PX: COLONOSCOPY WITH ESOPHAGOGASTRODUODENOSCOPY (EGD): SHX5779

## 2019-01-29 ENCOUNTER — Ambulatory Visit: Payer: 59 | Admitting: Physician Assistant

## 2019-01-29 ENCOUNTER — Encounter: Payer: Self-pay | Admitting: Physician Assistant

## 2019-01-29 VITALS — BP 112/80 | HR 60 | Temp 97.8°F | Ht 60.0 in | Wt 129.2 lb

## 2019-01-29 DIAGNOSIS — K219 Gastro-esophageal reflux disease without esophagitis: Secondary | ICD-10-CM | POA: Diagnosis not present

## 2019-01-29 DIAGNOSIS — Z1159 Encounter for screening for other viral diseases: Secondary | ICD-10-CM

## 2019-01-29 DIAGNOSIS — K5900 Constipation, unspecified: Secondary | ICD-10-CM

## 2019-01-29 DIAGNOSIS — R1013 Epigastric pain: Secondary | ICD-10-CM

## 2019-01-29 DIAGNOSIS — R1084 Generalized abdominal pain: Secondary | ICD-10-CM

## 2019-01-29 DIAGNOSIS — K5909 Other constipation: Secondary | ICD-10-CM | POA: Diagnosis not present

## 2019-01-29 DIAGNOSIS — Z1211 Encounter for screening for malignant neoplasm of colon: Secondary | ICD-10-CM | POA: Diagnosis not present

## 2019-01-29 NOTE — Patient Instructions (Addendum)
If you are age 55 or older, your body mass index should be between 23-30. Your Body mass index is 25.24 kg/m. If this is out of the aforementioned range listed, please consider follow up with your Primary Care Provider.  If you are age 83 or younger, your body mass index should be between 19-25. Your Body mass index is 25.24 kg/m. If this is out of the aformentioned range listed, please consider follow up with your Primary Care Provider.   You have been scheduled for an endoscopy and colonoscopy. Please follow the written instructions given to you at your visit today. Please pick up your prep supplies at the pharmacy within the next 1-3 days. If you use inhalers (even only as needed), please bring them with you on the day of your procedure. Your physician has requested that you go to www.startemmi.com and enter the access code given to you at your visit today. This web site gives a general overview about your procedure. However, you should still follow specific instructions given to you by our office regarding your preparation for the procedure.  Start Miralax daily.  Continue Pantoprazole 40 mg daily.   __________________________________________________________  sainaasaat  6 5  nhait nhang aahtaat hpyitpark sang hkandharko htu htai nywhaann keinsai  2 3 mha  3 0  kyarr hpyit sang sai .  sang hkandharko aalayyhkyane nywhaann keinsai 25.24 kg / m hpyitsai . aakaal  ?innsai aahtaattwin hpawpyahtarrsaw aatineaatar nhang makitenye par k saineat muul hcawng shout mhu payy suu nhang saatswal par .   sainsaiaasaat  6 4  nhait nhang aahtaat hpyitpark sang hkandharko aalayyhkyane nywhaann keinsai  1 9 mha  2 5  aatwin hpyit sang sai .  sang hkandharko aalayyhkyane nywhaann keinsai 25.24 kg / m hpyitsai . aakaal  ?innsai hpawpyahtarrsaw a k n  a saat nhang m par shin saineat muul hcawng shout mhu payy suu nhanglitenarraan kyaayyjuupyu  hcain hcarr par .  sainsai endoscopy nhang colonoscopy  pyuloteraan  hcehcainhtarr pyee hpyitsai . kyaayyjuupyu  yanae saineat laipaat mhu  payysaw nywhaankyarrhkyet myarrko litenar par . Eugenia Mcalpine  saineat kyaotainpyinsainmhu myarrkolarmany  1- 3  raataatwin sayysine shoet yuu par . aakaal  sainsai shuu shite mi par sawaararmyarrko ( loaautsany aahkyanemhahc  pain)  aasonepyu par k saineat lotehtonelotenaee twin suuthoet nhang aatuu yuusaung larpar .  Miralax  ko naehcain hcatain par . Pantoprazole naehcain 40  melegaram ko naehcain sout par .

## 2019-01-29 NOTE — Progress Notes (Signed)
Chief Complaint: Abdominal burning pain and constipation  HPI:    Julia Watts is a Burmese female, who presents to clinic today accompanied by a translator and family member who also assists in translation, for a complaint of burning abdominal pain and constipation.    Today, the patient tells me that she feels a burning pain all over her entire stomach which started 4 years ago.  It seems to come and go.  She is not sure what makes it worse but placing a cool bottle of water over her stomach seems to help it slightly.  Initially, was started on Omeprazole 40 mg but this made no change in symptoms.  Recently over the past month started on Pantoprazole 40 mg daily which also has made no difference.  This pain is slightly worse when she eats spicy foods.    Also complains of constipation with a bowel movement every other day.  Tells me it is hard to get out the beginning of her stool and she has to strain and then it becomes softer.  She has never tried any laxatives.    Denies previous endoscopic evaluation.    Denies fever, chills, weight loss, nausea, vomiting, heartburn, reflux blood in her stool or symptoms that awaken her from sleep.  Past Medical History:  Diagnosis Date  . Coronary artery disease   . GERD (gastroesophageal reflux disease)     Past Surgical History:  Procedure Laterality Date  . TUBAL LIGATION      Current Outpatient Medications  Medication Sig Dispense Refill  . pantoprazole (PROTONIX) 40 MG tablet Take 1 tablet by mouth daily.     No current facility-administered medications for this visit.    Allergies as of 01/29/2019  . (No Known Allergies)    Family History  Problem Relation Age of Onset  . Diabetes Mother   . Heart disease Mother   . Lung cancer Mother   . Diabetes Father   . Heart disease Father     Social History   Socioeconomic History  . Marital status: Married    Spouse name: Not on file  . Number of children: 3  . Years of education:  Not on file  . Highest education level: Not on file  Occupational History  . Not on file  Tobacco Use  . Smoking status: Never Smoker  . Smokeless tobacco: Never Used  Substance and Sexual Activity  . Alcohol use: No  . Drug use: No  . Sexual activity: Not on file  Other Topics Concern  . Not on file  Social History Narrative   ** Merged History Encounter **       Social Determinants of Health   Financial Resource Strain:   . Difficulty of Paying Living Expenses: Not on file  Food Insecurity:   . Worried About Charity fundraiser in the Last Year: Not on file  . Ran Out of Food in the Last Year: Not on file  Transportation Needs:   . Lack of Transportation (Medical): Not on file  . Lack of Transportation (Non-Medical): Not on file  Physical Activity:   . Days of Exercise per Week: Not on file  . Minutes of Exercise per Session: Not on file  Stress:   . Feeling of Stress : Not on file  Social Connections:   . Frequency of Communication with Friends and Family: Not on file  . Frequency of Social Gatherings with Friends and Family: Not on file  . Attends Religious  Services: Not on file  . Active Member of Clubs or Organizations: Not on file  . Attends Archivist Meetings: Not on file  . Marital Status: Not on file  Intimate Partner Violence:   . Fear of Current or Ex-Partner: Not on file  . Emotionally Abused: Not on file  . Physically Abused: Not on file  . Sexually Abused: Not on file    Review of Systems:    Constitutional: No weight loss, fever or chills Skin: No rash  Cardiovascular: No chest pain Respiratory: No SOB  Gastrointestinal: See HPI and otherwise negative Genitourinary: No dysuria Neurological: No headache Musculoskeletal: No new muscle or joint pain Hematologic: No bleeding  Psychiatric: No history of depression or anxiety   Physical Exam:  Vital signs: BP 112/80 (BP Location: Left Arm, Patient Position: Sitting, Cuff Size: Normal)    Pulse 60   Temp 97.8 F (36.6 C)   Ht 5' (1.524 m) Comment: height measured without shoes  Wt 129 lb 4 oz (58.6 kg)   BMI 25.24 kg/m   Constitutional:   Pleasant female appears to be in NAD, Well developed, Well nourished, alert and cooperative Head:  Normocephalic and atraumatic. Eyes:   PEERL, EOMI. No icterus. Conjunctiva pink. Ears:  Normal auditory acuity. Neck:  Supple Throat: Oral cavity and pharynx without inflammation, swelling or lesion.  Respiratory: Respirations even and unlabored. Lungs clear to auscultation bilaterally.   No wheezes, crackles, or rhonchi.  Cardiovascular: Normal S1, S2. No MRG. Regular rate and rhythm. No peripheral edema, cyanosis or pallor.  Gastrointestinal:  Soft, nondistended, mild epigastric ttp. No rebound or guarding. Normal bowel sounds. No appreciable masses or hepatomegaly. Rectal:  Not performed.  Msk:  Symmetrical without gross deformities. Without edema, no deformity or joint abnormality.  Neurologic:  Alert and  oriented x4;  grossly normal neurologically.  Skin:   Dry and intact without significant lesions or rashes. Psychiatric: Demonstrates good judgement and reason without abnormal affect or behaviors.  No recent labs.  Assessment: 1.  GERD: Describes burning over entirety of stomach, no change with Omeprazole 40 mg daily or Pantoprazole 40 mg daily, is worse with spicy foods; consider gastritis+/-H. pylori 2.  Generalized abdominal pain: Describes a burning sensation, could be related to reflux+/-constipation/IBS 3.  Constipation: Chronic for the patient, has never tried laxatives  Plan: 1.  Continue Pantoprazole 40 mg daily 2.  Patient is due for colon cancer screening.  We will go ahead and set her up for a screening colonoscopy and a diagnostic EGD to be done at the same time.  These are scheduled with Dr. Carlean Purl in the [facility].  Did discuss risks, benefits, limitations and alternatives and the patient agrees to proceed.  Patient  will be Covid tested 2 days prior to time of exam. 3.  Would recommend the patient start MiraLAX on a daily basis.  Discussed titration of this for a soft solid stool. 4.  Patient to follow in clinic per recommendations from Dr. Carlean Purl after time of procedures.  Ellouise Newer, PA-C [facility] 01/29/2019, 3:35 PM  Cc: No ref. provider found

## 2019-02-08 ENCOUNTER — Encounter (HOSPITAL_COMMUNITY): Payer: Self-pay

## 2019-02-08 ENCOUNTER — Ambulatory Visit (HOSPITAL_COMMUNITY)
Admission: EM | Admit: 2019-02-08 | Discharge: 2019-02-08 | Disposition: A | Discharge status: Home / Self Care | Payer: 59 | Attending: Family Medicine | Admitting: Family Medicine

## 2019-02-08 ENCOUNTER — Other Ambulatory Visit: Payer: Self-pay

## 2019-02-08 ENCOUNTER — Ambulatory Visit (INDEPENDENT_AMBULATORY_CARE_PROVIDER_SITE_OTHER): Payer: 59

## 2019-02-08 DIAGNOSIS — S20212A Contusion of left front wall of thorax, initial encounter: Secondary | ICD-10-CM

## 2019-02-08 DIAGNOSIS — R0782 Intercostal pain: Secondary | ICD-10-CM | POA: Diagnosis not present

## 2019-02-08 DIAGNOSIS — R0789 Other chest pain: Secondary | ICD-10-CM | POA: Diagnosis not present

## 2019-02-08 MED ORDER — NAPROXEN 500 MG PO TABS: 500.0000 mg | ORAL_TABLET | Freq: Two times a day (BID) | ORAL | 0 refills | Status: DC
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
## 2019-02-08 NOTE — ED Provider Notes (Signed)
[place]    CSN: [id number] Arrival date & time: 02/08/19  1026      History   Chief Complaint Chief Complaint  Patient presents with  . Chest Injury    HPI Julia Watts is a 54 y.o. female.   HPI Was horsing around with young son and he lashed out with his foot and accidentally kicked her in the chest just above her left breast A week later still painful with deep breath.  Pain with left arm movements.  Difficulty sleeping at night.  She has taken no medicine for the pain. No fever or chills.  No cough or sputum production.  No underlying lung disease. Patient is scheduled to have an endoscopy for her GERD in about a week.  Past Medical History:  Diagnosis Date  . Coronary artery disease   . GERD (gastroesophageal reflux disease)     There are no problems to display for this patient.   Past Surgical History:  Procedure Laterality Date  . TUBAL LIGATION      OB History    Gravida  0   Para  0   Term  0   Preterm  0   AB  0   Living        SAB  0   TAB  0   Ectopic  0   Multiple      Live Births               Home Medications    Prior to Admission medications   Medication Sig Start Date End Date Taking? Authorizing Provider  pantoprazole (PROTONIX) 40 MG tablet Take 1 tablet by mouth daily. 01/07/19 02/06/19  [provider]    Family History Family History  Problem Relation Age of Onset  . Diabetes Mother   . Heart disease Mother   . Lung cancer Mother   . Diabetes Father   . Heart disease Father     Social History Social History   Tobacco Use  . Smoking status: Never Smoker  . Smokeless tobacco: Never Used  Substance Use Topics  . Alcohol use: No  . Drug use: No     Allergies   Patient has no known allergies.   Review of Systems Review of Systems  Constitutional: Negative for chills and fever.  Respiratory: Positive for chest tightness. Negative for cough and shortness of breath.     Cardiovascular: Positive for chest pain.       Rib pain with deep breath  Psychiatric/Behavioral: Positive for sleep disturbance.     Physical Exam Triage Vital Signs ED Triage Vitals  Enc Vitals Group     BP 02/08/19 1054 115/81     Pulse Rate 02/08/19 1054 77     Resp 02/08/19 1054 16     Temp 02/08/19 1054 98.4 F (36.9 C)     Temp Source 02/08/19 1054 Oral     SpO2 02/08/19 1054 98 %     Weight --      Height --      Head Circumference --      Peak Flow --      Pain Score 02/08/19 1053 8     Pain Loc --      Pain Edu? --      Excl. in [place]? --    No data found.  Updated Vital Signs BP 115/81 (BP Location: Left Arm)   Pulse 77   Temp 98.4 F (36.9 C) (  Oral)   Resp 16   SpO2 98%      Physical Exam Constitutional:      General: She is not in acute distress.    Appearance: She is well-developed and normal weight.     Comments: Small asian woman.  NO distress  HENT:     Head: Normocephalic and atraumatic.     Mouth/Throat:     Comments: Mask in place Eyes:     Conjunctiva/sclera: Conjunctivae normal.     Pupils: Pupils are equal, round, and reactive to light.  Cardiovascular:     Rate and Rhythm: Normal rate and regular rhythm.     Heart sounds: Normal heart sounds.  Pulmonary:     Effort: Pulmonary effort is normal. No respiratory distress.     Breath sounds: Normal breath sounds. No wheezing or rales.  Chest:     Chest wall: Tenderness present.    Abdominal:     General: There is no distension.     Palpations: Abdomen is soft.  Musculoskeletal:        General: Normal range of motion.     Cervical back: Normal range of motion.  Skin:    General: Skin is warm and dry.  Neurological:     Mental Status: She is alert.      UC Treatments / Results  Labs (all labs ordered are listed, but only abnormal results are displayed) Labs Reviewed - No data to display  EKG   Radiology DG Ribs Unilateral W/Chest Left  Result Date: 02/08/2019 CLINICAL  DATA:  Left rib pain after injury last week. EXAM: LEFT RIBS AND CHEST - 3+ VIEW COMPARISON:  None. FINDINGS: No fracture or other bone lesions are seen involving the ribs. There is no evidence of pneumothorax or pleural effusion. Both lungs are clear. Heart size and mediastinal contours are within normal limits. IMPRESSION: Negative. Electronically Signed   By: Marijo Conception M.D.   On: 02/08/2019 11:36    Procedures Procedures (including critical care time)  Medications Ordered in UC Medications - No data to display  Initial Impression / Assessment and Plan / UC Course  I have reviewed the triage vital signs and the nursing notes.  Pertinent labs & imaging results that were available during my care of the patient were reviewed by me and considered in my medical decision making (see chart for details).     Reviewed rib contusion.  Healing.  Reasons for return Final Clinical Impressions(s) / UC Diagnoses   Final diagnoses:  Rib contusion, left, initial encounter     Discharge Instructions     Bruised ribs will take time to heal You may use ice or heat to area Take tylenol 1000 mg every 4-6 hours for pain Continue other medication Return as needed      ED Prescriptions    Medication Sig Dispense Auth. Provider   naproxen (NAPROSYN) 500 MG tablet  (Status: Discontinued) Take 1 tablet (500 mg total) by mouth 2 (two) times daily. 30 tablet Raylene Everts, MD     PDMP not reviewed this encounter.   Raylene Everts, MD 02/08/19 [phone]

## 2019-02-08 NOTE — Discharge Instructions (Addendum)
Bruised ribs will take time to heal You may use ice or heat to area Take tylenol 1000 mg every 4-6 hours for pain Continue other medication Return as needed

## 2019-02-12 ENCOUNTER — Other Ambulatory Visit: Payer: Self-pay | Admitting: Internal Medicine

## 2019-02-12 ENCOUNTER — Ambulatory Visit (INDEPENDENT_AMBULATORY_CARE_PROVIDER_SITE_OTHER): Payer: 59

## 2019-02-12 DIAGNOSIS — Z1159 Encounter for screening for other viral diseases: Secondary | ICD-10-CM

## 2019-02-13 LAB — SARS CORONAVIRUS 2 (TAT 6-24 HRS): SARS Coronavirus 2: NEGATIVE

## 2019-02-17 ENCOUNTER — Other Ambulatory Visit: Payer: Self-pay

## 2019-02-17 ENCOUNTER — Ambulatory Visit (AMBULATORY_SURGERY_CENTER): Payer: 59 | Admitting: Internal Medicine

## 2019-02-17 ENCOUNTER — Encounter: Payer: Self-pay | Admitting: Internal Medicine

## 2019-02-17 VITALS — BP 106/76 | HR 66 | Temp 97.5°F | Resp 18 | Ht 60.0 in | Wt 129.0 lb

## 2019-02-17 DIAGNOSIS — D125 Benign neoplasm of sigmoid colon: Secondary | ICD-10-CM

## 2019-02-17 DIAGNOSIS — R1013 Epigastric pain: Secondary | ICD-10-CM

## 2019-02-17 DIAGNOSIS — Z1211 Encounter for screening for malignant neoplasm of colon: Secondary | ICD-10-CM | POA: Diagnosis not present

## 2019-02-17 MED ORDER — SODIUM CHLORIDE 0.9 % IV SOLN: 500.0000 mL | Freq: Once | INTRAVENOUS | Status: DC

## 2019-02-17 NOTE — Progress Notes (Signed)
Son interpreting for pt, pt sleeping for a few minutes in RR then wakes without complaints.

## 2019-02-17 NOTE — Op Note (Addendum)
[place] Patient Name: Julia Watts Procedure Date: 02/17/2019 2:13 PM MRN: [id number] Endoscopist: Gatha Mayer , MD Age: 54 Referring MD:  Date of Birth: 12-15-1965 Gender: Female Account #: [id number] Procedure:                Upper GI endoscopy Indications:              Epigastric abdominal pain Medicines:                Propofol per Anesthesia, Monitored Anesthesia Care Procedure:                Pre-Anesthesia Assessment:                           - Prior to the procedure, a History and Physical                            was performed, and patient medications and                            allergies were reviewed. The patient's tolerance of                            previous anesthesia was also reviewed. The risks                            and benefits of the procedure and the sedation                            options and risks were discussed with the patient.                            All questions were answered, and informed consent                            was obtained. Prior Anticoagulants: The patient has                            taken no previous anticoagulant or antiplatelet                            agents. ASA Grade Assessment: II - A patient with                            mild systemic disease. After reviewing the risks                            and benefits, the patient was deemed in                            satisfactory condition to undergo the procedure.                           After obtaining informed consent, the endoscope was  passed under direct vision. Throughout the                            procedure, the patient's blood pressure, pulse, and                            oxygen saturations were monitored continuously. The                            Endoscope was introduced through the mouth, and                            advanced to the second part of duodenum. The upper                            GI  endoscopy was accomplished without difficulty.                            The patient tolerated the procedure well. Scope In: Scope Out: Findings:                 The esophagus was normal.                           The stomach was normal.                           The examined duodenum was normal.                           The cardia and gastric fundus were normal on                            retroflexion. Complications:            No immediate complications. Estimated Blood Loss:     Estimated blood loss: none. Impression:               - Normal esophagus.                           - Normal stomach.                           - Normal examined duodenum.                           - No specimens collected. Recommendation:           - Patient has a contact number available for                            emergencies. The signs and symptoms of potential                            delayed complications were discussed with the  patient. Return to normal activities tomorrow.                            Written discharge instructions were provided to the                            patient.                           - Resume previous diet.                           - Continue present medications.                           - See the other procedure note for documentation of                            additional recommendations. Colonoscopy next                           - return to Julia Watts for the following - would                            try tricyclic medication like amitriptyline 25-50                            mg qhs for constant burning abdominal pain. She may                            continue pantoprazole since it does also seem like                            she has GERD sxs but if gets better on                            amitriptyline would see about reducing dose or                            eliminating PPI if possible Gatha Mayer, MD 02/17/2019  2:55:35 PM This report has been signed electronically.

## 2019-02-17 NOTE — Op Note (Addendum)
[place] Patient Name: Julia Watts Procedure Date: 02/17/2019 2:13 PM MRN: [id number] Endoscopist: Gatha Mayer , MD Age: 54 Referring MD:  Date of Birth: [birth year] Gender: Female Account #: [id number] Procedure:                Colonoscopy Indications:              Screening for colorectal malignant neoplasm Medicines:                Propofol per Anesthesia, Monitored Anesthesia Care Procedure:                Pre-Anesthesia Assessment:                           - Prior to the procedure, a History and Physical                            was performed, and patient medications and                            allergies were reviewed. The patient's tolerance of                            previous anesthesia was also reviewed. The risks                            and benefits of the procedure and the sedation                            options and risks were discussed with the patient.                            All questions were answered, and informed consent                            was obtained. Prior Anticoagulants: The patient has                            taken no previous anticoagulant or antiplatelet                            agents. ASA Grade Assessment: II - A patient with                            mild systemic disease. After reviewing the risks                            and benefits, the patient was deemed in                            satisfactory condition to undergo the procedure.                           After obtaining informed consent, the colonoscope  was passed under direct vision. Throughout the                            procedure, the patient's blood pressure, pulse, and                            oxygen saturations were monitored continuously. The                            Colonoscope was introduced through the anus and                            advanced to the the cecum, identified by   appendiceal orifice and ileocecal valve. The                            colonoscopy was performed without difficulty. The                            patient tolerated the procedure well. The quality                            of the bowel preparation was excellent. The                            ileocecal valve, appendiceal orifice, and rectum                            were photographed. The bowel preparation used was                            Miralax via split dose instruction. Scope In: 2:25:43 PM Scope Out: 2:37:12 PM Scope Withdrawal Time: 0 hours 7 minutes 13 seconds  Total Procedure Duration: 0 hours 11 minutes 29 seconds  Findings:                 The perianal and digital rectal examinations were                            normal.                           A 4 mm polyp was found in the sigmoid colon. The                            polyp was sessile. The polyp was removed with a                            cold snare. Resection and retrieval were complete.                            Verification of patient identification for the                            specimen was done.  Estimated blood loss was minimal.                           The exam was otherwise without abnormality on                            direct and retroflexion views. Complications:            No immediate complications. Estimated Blood Loss:     Estimated blood loss was minimal. Impression:               - One 4 mm polyp in the sigmoid colon, removed with                            a cold snare. Resected and retrieved.                           - The examination was otherwise normal on direct                            and retroflexion views. Recommendation:           - Patient has a contact number available for                            emergencies. The signs and symptoms of potential                            delayed complications were discussed with the                            patient. Return to normal  activities tomorrow.                            Written discharge instructions were provided to the                            patient.                           - Resume previous diet.                           - Continue present medications. Stay on MiraLax                            which has helped constipation                           - Repeat colonoscopy is recommended. The                            colonoscopy date will be determined after pathology                            results from today's exam become available for  review. Gatha Mayer, MD 02/17/2019 2:58:48 PM This report has been signed electronically.

## 2019-02-17 NOTE — Patient Instructions (Addendum)
The esophagus, stomach and upper intestine look ok - normal.  The burning pain in the abdomen may be due to irritated nerves in the wall of the abdomen.  I am making recommendations to try to help that. Please go back to see Lorenda Hatchet, FNP about this.  There was a tiny polyp seen and removed from the colon. A polyp can sometimes turn into a cancer - so we remove them to prevent that. All else ok in the colon.  I will let you know pathology results and when to have another routine colonoscopy by mail and/or My Chart.  I appreciate the opportunity to care for you. Gatha Mayer, MD, [facility]  Handout for polyps given.  YOU HAD AN ENDOSCOPIC PROCEDURE TODAY AT [facility]:   Refer to the procedure report that was given to you for any specific questions about what was found during the examination.  If the procedure report does not answer your questions, please call your gastroenterologist to clarify.  If you requested that your care partner not be given the details of your procedure findings, then the procedure report has been included in a sealed envelope for you to review at your convenience later.  YOU SHOULD EXPECT: Some feelings of bloating in the abdomen. Passage of more gas than usual.  Walking can help get rid of the air that was put into your GI tract during the procedure and reduce the bloating. If you had a lower endoscopy (such as a colonoscopy or flexible sigmoidoscopy) you may notice spotting of blood in your stool or on the toilet paper. If you underwent a bowel prep for your procedure, you may not have a normal bowel movement for a few days.  Please Note:  You might notice some irritation and congestion in your nose or some drainage.  This is from the oxygen used during your procedure.  There is no need for concern and it should clear up in a day or so.  SYMPTOMS TO REPORT IMMEDIATELY:   Following lower endoscopy (colonoscopy or flexible  sigmoidoscopy):  Excessive amounts of blood in the stool  Significant tenderness or worsening of abdominal pains  Swelling of the abdomen that is new, acute  Fever of 100F or higher   Following upper endoscopy (EGD)  Vomiting of blood or coffee ground material  New chest pain or pain under the shoulder blades  Painful or persistently difficult swallowing  New shortness of breath  Fever of 100F or higher  Black, tarry-looking stools  For urgent or emergent issues, a gastroenterologist can be reached at any hour by calling [phone].   DIET:  We do recommend a small meal at first, but then you may proceed to your regular diet.  Drink plenty of fluids but you should avoid alcoholic beverages for 24 hours.  ACTIVITY:  You should plan to take it easy for the rest of today and you should NOT DRIVE or use heavy machinery until tomorrow (because of the sedation medicines used during the test).    FOLLOW UP: Our staff will call the number listed on your records 48-72 hours following your procedure to check on you and address any questions or concerns that you may have regarding the information given to you following your procedure. If we do not reach you, we will leave a message.  We will attempt to reach you two times.  During this call, we will ask if you have developed any symptoms of COVID 19.  If you develop any symptoms (ie: fever, flu-like symptoms, shortness of breath, cough etc.) before then, please call [phone].  If you test positive for Covid 19 in the 2 weeks post procedure, please call and report this information to [place].    If any biopsies were taken you will be contacted by phone or by letter within the next 1-3 weeks.  Please call us at [phone] if you have not heard about the biopsies in 3 weeks.    SIGNATURES/CONFIDENTIALITY: You and/or your care partner have signed paperwork which will be entered into your electronic medical record.  These signatures attest to  the fact that that the information above on your After Visit Summary has been reviewed and is understood.  Full responsibility of the confidentiality of this discharge information lies with you and/or your care-partner.

## 2019-02-17 NOTE — Progress Notes (Signed)
Pt tolerated well. VSS. Awake and to recovery. 

## 2019-02-17 NOTE — Progress Notes (Signed)
Called to room to assist during endoscopic procedure.  Patient ID and intended procedure confirmed with present staff. Received instructions for my participation in the procedure from the performing physician.  

## 2019-02-17 NOTE — Progress Notes (Signed)
Temp  VS   Pt's states no medical or surgical changes since previsit or office visit.  Interpreter used today at the [facility] for this pt.  Interpreter's name is-Htet--pt's son

## 2019-02-19 ENCOUNTER — Telehealth: Payer: Self-pay | Admitting: *Deleted

## 2019-02-19 ENCOUNTER — Telehealth: Payer: Self-pay

## 2019-02-19 NOTE — Telephone Encounter (Signed)
  Second follow up phone call attempt, no answer, left message. 

## 2019-02-19 NOTE — Telephone Encounter (Signed)
  Follow up Call-  Call back number 02/17/2019  Post procedure Call Back phone  # [phone]  Permission to leave phone message Yes  Some recent data might be hidden     Patient questions:   Message left to call us if necessary.

## 2019-02-19 NOTE — Telephone Encounter (Signed)
Patient's son returned the call states the patient is doing well.

## 2019-02-26 ENCOUNTER — Other Ambulatory Visit: Payer: Self-pay

## 2019-02-26 ENCOUNTER — Ambulatory Visit (INDEPENDENT_AMBULATORY_CARE_PROVIDER_SITE_OTHER): Payer: 59 | Admitting: Physician Assistant

## 2019-02-26 ENCOUNTER — Encounter: Payer: Self-pay | Admitting: Internal Medicine

## 2019-02-26 ENCOUNTER — Encounter: Payer: Self-pay | Admitting: Physician Assistant

## 2019-02-26 VITALS — BP 94/70 | HR 60 | Temp 97.9°F | Ht 60.0 in | Wt 129.1 lb

## 2019-02-26 DIAGNOSIS — K30 Functional dyspepsia: Secondary | ICD-10-CM

## 2019-02-26 DIAGNOSIS — K5900 Constipation, unspecified: Secondary | ICD-10-CM

## 2019-02-26 DIAGNOSIS — Z86010 Personal history of colonic polyps: Secondary | ICD-10-CM

## 2019-02-26 DIAGNOSIS — Z860101 Personal history of adenomatous and serrated colon polyps: Secondary | ICD-10-CM | POA: Insufficient documentation

## 2019-02-26 HISTORY — DX: Personal history of colonic polyps: Z86.010

## 2019-02-26 HISTORY — DX: Personal history of adenomatous and serrated colon polyps: Z86.0101

## 2019-02-26 MED ORDER — AMITRIPTYLINE HCL 25 MG PO TABS: 25.0000 mg | ORAL_TABLET | Freq: Every day | ORAL | 5 refills | Status: DC

## 2019-02-26 NOTE — Patient Instructions (Addendum)
If you are age 55 or older, your body mass index should be between 23-30. Your Body mass index is 25.22 kg/m. If this is out of the aforementioned range listed, please consider follow up with your Primary Care Provider.  If you are age 42 or younger, your body mass index should be between 19-25. Your Body mass index is 25.22 kg/m. If this is out of the aformentioned range listed, please consider follow up with your Primary Care Provider.   We have sent the following medications to your pharmacy for you to pick up at your convenience: Amitriptyline 25 mg nightly.   Call office on March 1st to schedule follow up appointment.

## 2019-02-26 NOTE — Progress Notes (Signed)
Chief Complaint: Follow-up after EGD and colonoscopy  HPI:    Julia Watts is a 54 year old Burmese female, who returns to clinic today for follow-up after recent EGD and colonoscopy.  A family member is present to assist with interpretation.      01/29/2019 patient seen in clinic for abdominal burning pain and constipation.  At that time continued on pantoprazole 40 mg daily and schedule patient for an EGD and screening colonoscopy.  Also started on MiraLAX for some constipation.    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  Colonoscopy on the same day with one 4 mm adenoma in the sigmoid colon.  Repeat was recommended in 3 years.    Today, the patient returns to clinic accompanied by her son who helps interpret.  She explains that she continues with a burning sensation in her upper abdomen.  She has stopped the Pantoprazole because this just was not helping at all.  Her constipation is better and she has stopped the MiraLAX and is having normal bowel movements.    Denies fever, chills, weight loss, nausea or vomiting.     Past Medical History:  Diagnosis Date  . GERD (gastroesophageal reflux disease)   . Hyperlipidemia    yes per pt    Past Surgical History:  Procedure Laterality Date  . TUBAL LIGATION      No current outpatient medications on file.   No current facility-administered medications for this visit.    Allergies as of 02/26/2019  . (No Known Allergies)    Family History  Problem Relation Age of Onset  . Diabetes Mother   . Heart disease Mother   . Lung cancer Mother   . Diabetes Father   . Heart disease Father   . Colon cancer Neg Hx   . Esophageal cancer Neg Hx   . Rectal cancer Neg Hx   . Stomach cancer Neg Hx     Social History   Socioeconomic History  . Marital status: Married    Spouse name: Not on file  . Number of children: 3  . Years of education: Not on file  . Highest  education level: Not on file  Occupational History  . Not on file  Tobacco Use  . Smoking status: Never Smoker  . Smokeless tobacco: Never Used  Substance and Sexual Activity  . Alcohol use: No  . Drug use: No  . Sexual activity: Not on file  Other Topics Concern  . Not on file  Social History Narrative          Social Determinants of Health   Financial Resource Strain:   . Difficulty of Paying Living Expenses: Not on file  Food Insecurity:   . Worried About Charity fundraiser in the Last Year: Not on file  . Ran Out of Food in the Last Year: Not on file  Transportation Needs:   . Lack of Transportation (Medical): Not on file  . Lack of Transportation (Non-Medical): Not on file  Physical Activity:   . Days of Exercise per Week: Not on file  . Minutes of Exercise per Session: Not on file  Stress:   . Feeling of Stress : Not on file  Social Connections:   . Frequency of Communication with Friends and Family: Not on file  . Frequency of Social Gatherings with Friends and Family: Not on file  . Attends Religious Services: Not on file  . Active  Member of Clubs or Organizations: Not on file  . Attends Archivist Meetings: Not on file  . Marital Status: Not on file  Intimate Partner Violence:   . Fear of Current or Ex-Partner: Not on file  . Emotionally Abused: Not on file  . Physically Abused: Not on file  . Sexually Abused: Not on file    Review of Systems:    Constitutional: No weight loss, fever or chills Cardiovascular: No chest pain  Respiratory: No SOB  Gastrointestinal: See HPI and otherwise negative   Physical Exam:  Vital signs: BP 94/70 (BP Location: Left Arm, Patient Position: Sitting, Cuff Size: Normal)   Pulse 60   Temp 97.9 F (36.6 C)   Ht 5' (1.524 m)   Wt 129 lb 2 oz (58.6 kg)   SpO2 100%   BMI 25.22 kg/m   Constitutional:   Pleasant female appears to be in NAD, Well developed, Well nourished, alert and cooperative Respiratory:  Respirations even and unlabored. Lungs clear to auscultation bilaterally.   No wheezes, crackles, or rhonchi.  Cardiovascular: Normal S1, S2. No MRG. Regular rate and rhythm. No peripheral edema, cyanosis or pallor.  Gastrointestinal:  Soft, nondistended, nontender. No rebound or guarding. Normal bowel sounds. No appreciable masses or hepatomegaly. Rectal:  Not performed.  Psychiatric: Demonstrates good judgement and reason without abnormal affect or behaviors.  No recent labs.  Assessment: 1.  History of tubular adenoma: Found at time of recent colonoscopy 2.  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  Constipation: Resolved after brief use of MiraLAX  Plan: 1.  Started the patient on Amitriptyline 25 mg nightly #30 with 5 refills. 2.  Told the patient and her son to call and let us know how she is doing in a couple of weeks, if no benefit from this medicine can try increasing to 50 mg nightly 3.  Patient has already discontinued Pantoprazole as it was not helping. 4.  Recommend the patient follow-up with me in 4 to 6 weeks or sooner if necessary.  Ellouise Newer, PA-C [facility] 02/26/2019, 10:07 AM  Cc: Lorenda Hatchet, FNP

## 2019-04-05 ENCOUNTER — Encounter (HOSPITAL_COMMUNITY): Payer: Self-pay

## 2019-04-05 ENCOUNTER — Other Ambulatory Visit: Payer: Self-pay

## 2019-04-05 ENCOUNTER — Ambulatory Visit (HOSPITAL_COMMUNITY)
Admission: EM | Admit: 2019-04-05 | Discharge: 2019-04-05 | Disposition: A | Discharge status: Home / Self Care | Payer: 59 | Attending: Physician Assistant | Admitting: Physician Assistant

## 2019-04-05 DIAGNOSIS — R42 Dizziness and giddiness: Secondary | ICD-10-CM

## 2019-04-05 MED ORDER — ONDANSETRON 4 MG PO TBDP
ORAL_TABLET | ORAL | Status: AC
  Filled 2019-04-05: qty 1

## 2019-04-05 MED ORDER — ONDANSETRON HCL 4 MG PO TABS: 4.0000 mg | ORAL_TABLET | Freq: Four times a day (QID) | ORAL | 0 refills | Status: DC

## 2019-04-05 MED ORDER — ONDANSETRON 4 MG PO TBDP
4.0000 mg | ORAL_TABLET | Freq: Once | ORAL | Status: AC
  Administered 2019-04-05: 15:00:00 4 mg via ORAL

## 2019-04-05 MED ORDER — MECLIZINE HCL 50 MG PO TABS: 25.0000 mg | ORAL_TABLET | Freq: Three times a day (TID) | ORAL | 0 refills | Status: DC | PRN

## 2019-04-05 NOTE — ED Provider Notes (Signed)
[facility]    CSN: [id number] Arrival date & time: 04/05/19  1322      History   Chief Complaint Chief Complaint  Patient presents with  . Dizziness    HPI Julia Watts is a 54 y.o. female.   Patient reports urgent care for 1 day history of dizziness.  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  She reports yesterday experiencing dizziness with laying down and at times when standing.  She reports a room spinning sensation.  She denies lightheaded feeling like she will pass out.  She reports some nausea with these episodes but no vomiting.  Denies chest pain or shortness of breath with these episodes.  She does report some ringing in her ears.  Denies change in her hearing.  Denies change in her vision.  Denies headache.  Denies falls.  Denies history of similar issues.  Only other medical history is chronic abdominal pain which she follows with a GI specialist for.  No history of hypertension or hypertension medications.     Past Medical History:  Diagnosis Date  . GERD (gastroesophageal reflux disease)   . Hx of adenomatous polyp of colon 02/26/2019  . Hyperlipidemia    yes per pt    Patient Active Problem List   Diagnosis Date Noted  . Hx of adenomatous polyp of colon 02/26/2019    Past Surgical History:  Procedure Laterality Date  . TUBAL LIGATION      OB History    Gravida  0   Para  0   Term  0   Preterm  0   AB  0   Living        SAB  0   TAB  0   Ectopic  0   Multiple      Live Births               Home Medications    Prior to Admission medications   Medication Sig Start Date End Date Taking? Authorizing Provider  meclizine (ANTIVERT) 50 MG tablet Take 0.5 tablets (25 mg total) by mouth 3 (three) times daily as needed for dizziness or nausea. 04/05/19   Raihana Balderrama, Marguerita Beards, PA-C  ondansetron (ZOFRAN) 4 MG tablet Take 1 tablet (4 mg total) by mouth every 6 (six) hours. 04/05/19   Omesha Bowerman, Marguerita Beards, PA-C   amitriptyline (ELAVIL) 25 MG tablet Take 1 tablet (25 mg total) by mouth at bedtime. Patient not taking: Reported on 04/05/2019 02/26/19 04/05/19  Levin Erp, PA    Family History Family History  Problem Relation Age of Onset  . Diabetes Mother   . Heart disease Mother   . Lung cancer Mother   . Diabetes Father   . Heart disease Father   . Colon cancer Neg Hx   . Esophageal cancer Neg Hx   . Rectal cancer Neg Hx   . Stomach cancer Neg Hx     Social History Social History   Tobacco Use  . Smoking status: Never Smoker  . Smokeless tobacco: Never Used  Substance Use Topics  . Alcohol use: No  . Drug use: No     Allergies   Patient has no known allergies.   Review of Systems Review of Systems  Constitutional: Negative for chills and fever.  HENT: Positive for tinnitus. Negative for congestion, ear pain, hearing loss and sore throat.   Eyes: Negative for pain and visual disturbance.  Respiratory: Negative for  cough and shortness of breath.   Cardiovascular: Negative for chest pain and palpitations.  Gastrointestinal: Positive for nausea. Negative for abdominal pain and vomiting.  Musculoskeletal: Negative for arthralgias, back pain and myalgias.  Skin: Negative for color change and rash.  Neurological: Positive for dizziness. Negative for tremors, seizures, syncope, facial asymmetry, speech difficulty, weakness, light-headedness, numbness and headaches.  All other systems reviewed and are negative.    Physical Exam Triage Vital Signs ED Triage Vitals  Enc Vitals Group     BP 04/05/19 1343 119/81     Pulse Rate 04/05/19 1343 69     Resp 04/05/19 1343 16     Temp 04/05/19 1343 98.1 F (36.7 C)     Temp Source 04/05/19 1343 Oral     SpO2 04/05/19 1343 98 %     Weight 04/05/19 1342 125 lb (56.7 kg)     Height --      Head Circumference --      Peak Flow --      Pain Score --      Pain Loc --      Pain Edu? --      Excl. in [place]? --    No data found.   Updated Vital Signs BP 119/81 (BP Location: Right Arm)   Pulse 69   Temp 98.1 F (36.7 C) (Oral)   Resp 16   Wt 125 lb (56.7 kg)   SpO2 98%   BMI 24.41 kg/m   Visual Acuity Right Eye Distance:   Left Eye Distance:   Bilateral Distance:    Right Eye Near:   Left Eye Near:    Bilateral Near:     Physical Exam Vitals and nursing note reviewed.  Constitutional:      General: She is not in acute distress.    Appearance: Normal appearance. She is well-developed. She is not ill-appearing.  HENT:     Head: Normocephalic and atraumatic.     Right Ear: Tympanic membrane normal.     Left Ear: Tympanic membrane normal.     Nose: Nose normal.     Mouth/Throat:     Mouth: Mucous membranes are moist.  Eyes:     Extraocular Movements: Extraocular movements intact.     Conjunctiva/sclera: Conjunctivae normal.     Pupils: Pupils are equal, round, and reactive to light.  Cardiovascular:     Rate and Rhythm: Normal rate and regular rhythm.     Heart sounds: No murmur.  Pulmonary:     Effort: Pulmonary effort is normal. No respiratory distress.     Breath sounds: Normal breath sounds.  Abdominal:     Palpations: Abdomen is soft.     Tenderness: There is no abdominal tenderness.  Musculoskeletal:     Cervical back: Neck supple.     Right lower leg: No edema.     Left lower leg: No edema.  Skin:    General: Skin is warm and dry.     Findings: No rash.  Neurological:     General: No focal deficit present.     Mental Status: She is alert and oriented to person, place, and time.     Cranial Nerves: No cranial nerve deficit.     Motor: No weakness.     Coordination: Coordination normal.     Gait: Gait normal.     Comments: Good finger-to-nose and rapid alternating movement.  Hearing grossly intact.  Dizziness elicited with Patient to a supine position and turning head to  right.  No nystagmus was appreciated.  Patient also reports there is a feeling when sitting back up.  These  episodes resolved after 15 to 25 seconds.      UC Treatments / Results  Labs (all labs ordered are listed, but only abnormal results are displayed) Labs Reviewed - No data to display  EKG Normal sinus rhythm  Radiology No results found.  Procedures Procedures (including critical care time)  Medications Ordered in UC Medications  ondansetron (ZOFRAN-ODT) disintegrating tablet 4 mg (4 mg Oral Given 04/05/19 1448)    Initial Impression / Assessment and Plan / UC Course  I have reviewed the triage vital signs and the nursing notes.  Pertinent labs & imaging results that were available during my care of the patient were reviewed by me and considered in my medical decision making (see chart for details).     #Vertigo Patient 54 year old female presenting with acute vertigo.  Believe positional versus Mnire's at this point.  We will treat symptomatically with nausea medication and Antivert.  Discussed with son that if she has continued nonstop vertigo that she should go to the emergency department.  Primary care follow-up instructions were given. -ECG normal sinus rhythm.  Do not suspect cardiac etiology.   Final Clinical Impressions(s) / UC Diagnoses   Final diagnoses:  Vertigo     Discharge Instructions     Take the meclizine and Zofran for vertigo and nausea.  You may take the meclizine 3 times a day, and this applies to the Zofran as well.  If your symptoms become severe such that your dizziness does not subside in medications are not working I would like for you to go to the emergency department.  Please call the number attached for primary care follow-up.  I also try the maneuvers attached.  They may help      ED Prescriptions    Medication Sig Dispense Auth. Provider   ondansetron (ZOFRAN) 4 MG tablet Take 1 tablet (4 mg total) by mouth every 6 (six) hours. 12 tablet Belanna Manring, Marguerita Beards, PA-C   meclizine (ANTIVERT) 50 MG tablet Take 0.5 tablets (25 mg total) by  mouth 3 (three) times daily as needed for dizziness or nausea. 30 tablet Taris Galindo, Marguerita Beards, PA-C     PDMP not reviewed this encounter.   Purnell Shoemaker, PA-C 04/05/19 1635

## 2019-04-05 NOTE — Discharge Instructions (Signed)
Take the meclizine and Zofran for vertigo and nausea.  You may take the meclizine 3 times a day, and this applies to the Zofran as well.  If your symptoms become severe such that your dizziness does not subside in medications are not working I would like for you to go to the emergency department.  Please call the number attached for primary care follow-up.  I also try the maneuvers attached.  They may help

## 2019-04-05 NOTE — ED Triage Notes (Signed)
Pt state she has been feeling dizzy x 1 day.  Pt states has not taken for the dizziness.

## 2019-04-13 ENCOUNTER — Ambulatory Visit: Payer: 59 | Admitting: Internal Medicine

## 2019-04-13 ENCOUNTER — Encounter: Payer: Self-pay | Admitting: Internal Medicine

## 2019-04-13 VITALS — BP 119/75 | HR 72 | Temp 98.4°F

## 2019-04-13 DIAGNOSIS — Z1239 Encounter for other screening for malignant neoplasm of breast: Secondary | ICD-10-CM

## 2019-04-13 DIAGNOSIS — Z0000 Encounter for general adult medical examination without abnormal findings: Secondary | ICD-10-CM | POA: Insufficient documentation

## 2019-04-13 DIAGNOSIS — H8110 Benign paroxysmal vertigo, unspecified ear: Secondary | ICD-10-CM

## 2019-04-13 DIAGNOSIS — Z23 Encounter for immunization: Secondary | ICD-10-CM

## 2019-04-13 NOTE — Progress Notes (Signed)
Internal Medicine Clinic Attending  Case discussed with Dr. Melvin at the time of the visit.  We reviewed the resident's history and exam and pertinent patient test results.  I agree with the assessment, diagnosis, and plan of care documented in the resident's note.  Alexander Raines, M.D., Ph.D.  

## 2019-04-13 NOTE — Assessment & Plan Note (Addendum)
Patient has had vertigo since about 04/04/2019. She was seen by [facility] for this on 04/05/2019. Dix-Hallpike was positive for inducing vertigo at that time, (but no nystagmus nor vomiting). Dix-Hallpike not repeated today.  Patient describes room-spinning sensation triggered upon laying, standing, or looking down. This can occur bewtween a few and many times in a day and lasts for about 30 seconds or less each time. She had tinnitus when presenting to the ED, but denies tinnitus now. She has some nausea but no vomiting with these episodes. She denies hearing changes.  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 This was explained to her and her son with the help of a Optometrist. Information on the Epley Maneuver was provided and referral to Vestibular PT was ordered. - Epley maneuver as able at home - Vestibular PT

## 2019-04-13 NOTE — Patient Instructions (Addendum)
Thank you for allowing [place] to care for you  For your vertigo - This is consistent with Benign Paroxysmal Positional Vertigo (BPPV) - This is treated with positioning exercises, Do these exercises on both sides - We will provide referral for more rehab/exercises to help with your vertigo   Follow up in about 1-2 months for lab work and to meet PCP   How to Perform the Epley Maneuver The Epley maneuver is an exercise that relieves symptoms of vertigo. Vertigo is the feeling that you or your surroundings are moving when they are not. When you feel vertigo, you may feel like the room is spinning and have trouble walking. Dizziness is a little different than vertigo. When you are dizzy, you may feel unsteady or light-headed. You can do this maneuver at home whenever you have symptoms of vertigo. You can do it up to 3 times a day until your symptoms go away. Even though the Epley maneuver may relieve your vertigo for a few weeks, it is possible that your symptoms will return. This maneuver relieves vertigo, but it does not relieve dizziness. What are the risks? If it is done correctly, the Epley maneuver is considered safe. Sometimes it can lead to dizziness or nausea that goes away after a short time. If you develop other symptoms, such as changes in vision, weakness, or numbness, stop doing the maneuver and call your health care provider. How to perform the Epley maneuver 1. Sit on the edge of a bed or table with your back straight and your legs extended or hanging over the edge of the bed or table. 2. Turn your head halfway toward the affected ear or side. 3. Lie backward quickly with your head turned until you are lying flat on your back. You may want to position a pillow under your shoulders. 4. Hold this position for 30 seconds. You may experience an attack of vertigo. This is normal. 5. Turn your head to the opposite direction until your unaffected ear is facing the floor. 6. Hold this  position for 30 seconds. You may experience an attack of vertigo. This is normal. Hold this position until the vertigo stops. 7. Turn your whole body to the same side as your head. Hold for another 30 seconds. 8. Sit back up. You can repeat this exercise up to 3 times a day. Follow these instructions at home:  After doing the Epley maneuver, you can return to your normal activities.  Ask your health care provider if there is anything you should do at home to prevent vertigo. He or she may recommend that you: ? Keep your head raised (elevated) with two or more pillows while you sleep. ? Do not sleep on the side of your affected ear. ? Get up slowly from bed. ? Avoid sudden movements during the day. ? Avoid extreme head movement, like looking up or bending over. Contact a health care provider if:  Your vertigo gets worse.  You have other symptoms, including: ? Nausea. ? Vomiting. ? Headache. Get help right away if:  You have vision changes.  You have a severe or worsening headache or neck pain.  You cannot stop vomiting.  You have new numbness or weakness in any part of your body. Summary  Vertigo is the feeling that you or your surroundings are moving when they are not.  The Epley maneuver is an exercise that relieves symptoms of vertigo.  If the Epley maneuver is done correctly, it is considered safe. You  can do it up to 3 times a day. This information is not intended to replace advice given to you by your health care provider. Make sure you discuss any questions you have with your health care provider. Document Revised: 12/21/2016 Document Reviewed: 11/29/2015 Elsevier Patient Education  2020 [employer].

## 2019-04-13 NOTE — Progress Notes (Signed)
CC: Establish Care, Vertigo  HPI:  Ms.Saliyah Ku Ku Lun is a 54 y.o. F with PMHx listed below presenting for Establish Care, Vertigo. Please see the A&P for the status of the patient's chronic medical problems.  Past Medical History:  Diagnosis Date  . GERD (gastroesophageal reflux disease)   . Hx of adenomatous polyp of colon 02/26/2019  . Hyperlipidemia    yes per pt   Past Surgical History:  Procedure Laterality Date  . TUBAL LIGATION     Family History  Problem Relation Age of Onset  . Diabetes Mother   . Heart disease Mother   . Lung cancer Mother   . Diabetes Father   . Heart disease Father   . Colon cancer Neg Hx   . Esophageal cancer Neg Hx   . Rectal cancer Neg Hx   . Stomach cancer Neg Hx    Social History   Socioeconomic History  . Marital status: Married    Spouse name: Not on file  . Number of children: 3  . Years of education: Not on file  . Highest education level: Not on file  Occupational History  . Not on file  Tobacco Use  . Smoking status: Never Smoker  . Smokeless tobacco: Never Used  Substance and Sexual Activity  . Alcohol use: No  . Drug use: No  . Sexual activity: Not on file  Other Topics Concern  . Not on file  Social History Narrative          Social Determinants of Health   Financial Resource Strain:   . Difficulty of Paying Living Expenses:   Food Insecurity:   . Worried About Charity fundraiser in the Last Year:   . Arboriculturist in the Last Year:   Transportation Needs:   . Film/video editor (Medical):   Marland Kitchen Lack of Transportation (Non-Medical):   Physical Activity:   . Days of Exercise per Week:   . Minutes of Exercise per Session:   Stress:   . Feeling of Stress :   Social Connections:   . Frequency of Communication with Friends and Family:   . Frequency of Social Gatherings with Friends and Family:   . Attends Religious Services:   . Active Member of Clubs or Organizations:   . Attends Theatre manager Meetings:   Marland Kitchen Marital Status:   Intimate Partner Violence:   . Fear of Current or Ex-Partner:   . Emotionally Abused:   Marland Kitchen Physically Abused:   . Sexually Abused:    Review of Systems:  Performed and all others negative.  Physical Exam:  Vitals:   04/13/19 1044  BP: 119/75  Pulse: 72  Temp: 98.4 F (36.9 C)  TempSrc: Oral  SpO2: 98%   Physical Exam Constitutional:      General: She is not in acute distress.    Appearance: Normal appearance.  Cardiovascular:     Rate and Rhythm: Normal rate and regular rhythm.     Pulses: Normal pulses.     Heart sounds: Normal heart sounds.  Pulmonary:     Effort: Pulmonary effort is normal. No respiratory distress.     Breath sounds: Normal breath sounds.  Abdominal:     General: Bowel sounds are normal. There is no distension.     Palpations: Abdomen is soft.     Tenderness: There is no abdominal tenderness.  Musculoskeletal:        General: No swelling or deformity.  Skin:    General: Skin is warm and dry.  Neurological:     General: No focal deficit present.     Mental Status: Mental status is at baseline.     Assessment & Plan:   See Encounters Tab for problem based charting.  Patient discussed with Dr. Rebeca Alert

## 2019-04-13 NOTE — Assessment & Plan Note (Addendum)
-   TDAP Given - Mammogram ordered - Will need screening labs as it has been a while - - - Will need fasting Lipid Panel so will get other labs along with this at follow up (Lipid Panel, CMP, CBC, A1c)  Follow up in 1-2 months

## 2019-04-15 ENCOUNTER — Ambulatory Visit (INDEPENDENT_AMBULATORY_CARE_PROVIDER_SITE_OTHER): Payer: 59 | Admitting: Physician Assistant

## 2019-04-15 ENCOUNTER — Encounter: Payer: Self-pay | Admitting: Physician Assistant

## 2019-04-15 VITALS — BP 98/66 | HR 78 | Temp 97.2°F | Ht 60.0 in | Wt 128.5 lb

## 2019-04-15 DIAGNOSIS — K30 Functional dyspepsia: Secondary | ICD-10-CM | POA: Diagnosis not present

## 2019-04-15 DIAGNOSIS — K5900 Constipation, unspecified: Secondary | ICD-10-CM

## 2019-04-15 NOTE — Patient Instructions (Signed)
If you are age 55 or older, your body mass index should be between 23-30. Your Body mass index is 25.1 kg/m. If this is out of the aforementioned range listed, please consider follow up with your Primary Care Provider.  If you are age 66 or younger, your body mass index should be between 19-25. Your Body mass index is 25.1 kg/m. If this is out of the aformentioned range listed, please consider follow up with your Primary Care Provider.   START FDgard 1 tablet twice daily.  Call in 2-3 weeks and notify the office of how you are doing.  Follow up as need.

## 2019-04-15 NOTE — Progress Notes (Signed)
Chief Complaint: Follow-up epigastric pain  HPI:    Julia Watts is a 54 year old Burmese female, known to Dr. Carlean Purl, who presents clinic today with her son who assists with interpreting, for follow-up of epigastric pain.    02/26/2019 patient seen in clinic for follow-up after EGD and colonoscopy.  At that time 02/17/2019 EGD was normal is recommend the patient be tried on amitriptyline for possible functional dyspepsia.  Colonoscopy on the same day with a 4 mm adenoma in the sigmoid colon and repeat was recommended in 3 years.  That time patient continue with a burning sensation in her abdomen regardless of pantoprazole so she has stopped this medicine.  She was started on amitriptyline 25 mg nightly.    Today, the patient tells me that the medicine was helping with her burning throughout the day, but it would always come back at night right before her next dose and she was also experiencing a numb feeling in her lips and felt overwhelmingly tired throughout the day after using this medicine.  She stopped it due to the side effects.  She continues with burning in her stomach.    Occasionally still suffers from constipation which is resolved with MiraLAX.    Denies fever, chills, weight loss or symptoms that awaken her from sleep.  Past Medical History:  Diagnosis Date  . GERD (gastroesophageal reflux disease)   . Hx of adenomatous polyp of colon 02/26/2019  . Hyperlipidemia    yes per pt    Past Surgical History:  Procedure Laterality Date  . TUBAL LIGATION      Current Outpatient Medications  Medication Sig Dispense Refill  . meclizine (ANTIVERT) 50 MG tablet Take 0.5 tablets (25 mg total) by mouth 3 (three) times daily as needed for dizziness or nausea. 30 tablet 0  . ondansetron (ZOFRAN) 4 MG tablet Take 1 tablet (4 mg total) by mouth every 6 (six) hours. 12 tablet 0   No current facility-administered medications for this visit.    Allergies as of 04/15/2019  . (No Known Allergies)      Family History  Problem Relation Age of Onset  . Diabetes Mother   . Heart disease Mother   . Lung cancer Mother   . Diabetes Father   . Heart disease Father   . Colon cancer Neg Hx   . Esophageal cancer Neg Hx   . Rectal cancer Neg Hx   . Stomach cancer Neg Hx     Social History   Socioeconomic History  . Marital status: Married    Spouse name: Not on file  . Number of children: 3  . Years of education: Not on file  . Highest education level: Not on file  Occupational History  . Not on file  Tobacco Use  . Smoking status: Never Smoker  . Smokeless tobacco: Never Used  Substance and Sexual Activity  . Alcohol use: No  . Drug use: No  . Sexual activity: Not on file  Other Topics Concern  . Not on file  Social History Narrative          Social Determinants of Health   Financial Resource Strain:   . Difficulty of Paying Living Expenses:   Food Insecurity:   . Worried About Charity fundraiser in the Last Year:   . Arboriculturist in the Last Year:   Transportation Needs:   . Film/video editor (Medical):   Marland Kitchen Lack of Transportation (Non-Medical):   Physical Activity:   .  Days of Exercise per Week:   . Minutes of Exercise per Session:   Stress:   . Feeling of Stress :   Social Connections:   . Frequency of Communication with Friends and Family:   . Frequency of Social Gatherings with Friends and Family:   . Attends Religious Services:   . Active Member of Clubs or Organizations:   . Attends Archivist Meetings:   Marland Kitchen Marital Status:   Intimate Partner Violence:   . Fear of Current or Ex-Partner:   . Emotionally Abused:   Marland Kitchen Physically Abused:   . Sexually Abused:     Review of Systems:    Constitutional: No weight loss, fever or chills Cardiovascular: No chest pain Respiratory: No SOB  Gastrointestinal: See HPI and otherwise negative   Physical Exam:  Vital signs: BP 98/66 (BP Location: Right Arm, Patient Position: Sitting, Cuff  Size: Normal)   Pulse 78   Temp (!) 97.2 F (36.2 C)   Ht 5' (1.524 m)   Wt 128 lb 8 oz (58.3 kg)   SpO2 98%   BMI 25.10 kg/m   Constitutional:   Pleasant Burmese female appears to be in NAD, Well developed, Well nourished, alert and cooperative Respiratory: Respirations even and unlabored. Lungs clear to auscultation bilaterally.   No wheezes, crackles, or rhonchi.  Cardiovascular: Normal S1, S2. No MRG. Regular rate and rhythm. No peripheral edema, cyanosis or pallor.  Gastrointestinal:  Soft, nondistended, nontender. No rebound or guarding. Normal bowel sounds. No appreciable masses or hepatomegaly. Rectal:  Not performed.  Psychiatric:  Demonstrates good judgement and reason without abnormal affect or behaviors.  NO new labs/imaging.  Assessment: 1.  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  Constipation: Resolved with as needed MiraLAX  Plan: 1.  Recommend the patient start a trial of FD Guard 1 tab twice a day, discussed that she can increase this to 2 tabs twice a day if it is working but not quite enough. 2.  Recommend the patient's son call and let us know how she is doing in the next 2 to 3 weeks. 3.  Patient to follow-up with [place] as needed in the future.  Ellouise Newer, PA-C [facility] 04/15/2019, 8:45 AM  Cc: Lorenda Hatchet, FNP

## 2019-05-11 ENCOUNTER — Ambulatory Visit
Admission: RE | Admit: 2019-05-11 | Discharge: 2019-05-11 | Disposition: A | Discharge status: Home / Self Care | Payer: 59 | Source: Ambulatory Visit | Attending: Internal Medicine | Admitting: Internal Medicine

## 2019-05-11 ENCOUNTER — Other Ambulatory Visit: Payer: Self-pay

## 2019-05-11 DIAGNOSIS — Z1239 Encounter for other screening for malignant neoplasm of breast: Secondary | ICD-10-CM

## 2019-05-25 ENCOUNTER — Encounter: Payer: 59 | Admitting: Internal Medicine

## 2019-06-03 ENCOUNTER — Other Ambulatory Visit: Payer: Self-pay

## 2019-06-03 ENCOUNTER — Encounter: Payer: Self-pay | Admitting: Internal Medicine

## 2019-06-03 ENCOUNTER — Ambulatory Visit: Payer: 59 | Admitting: Internal Medicine

## 2019-06-03 VITALS — BP 106/80 | HR 74 | Temp 98.0°F | Ht 61.0 in | Wt 132.2 lb

## 2019-06-03 DIAGNOSIS — E782 Mixed hyperlipidemia: Secondary | ICD-10-CM

## 2019-06-03 DIAGNOSIS — E785 Hyperlipidemia, unspecified: Secondary | ICD-10-CM | POA: Insufficient documentation

## 2019-06-03 DIAGNOSIS — Z0000 Encounter for general adult medical examination without abnormal findings: Secondary | ICD-10-CM | POA: Diagnosis not present

## 2019-06-03 DIAGNOSIS — H8110 Benign paroxysmal vertigo, unspecified ear: Secondary | ICD-10-CM

## 2019-06-03 DIAGNOSIS — K30 Functional dyspepsia: Secondary | ICD-10-CM | POA: Insufficient documentation

## 2019-06-03 NOTE — Patient Instructions (Addendum)
It was nice seeing you today! Thank you for choosing [facility] for your Primary Care.    Today we talked about:   1. Please return to the lab for blood work. You will need to come to the clinic and just have lab work drawn. No need to make an appointment for this. It will be important to not eat for at least 8 hours beforehand. I will call you with the results  2. Pap Smear: Please consider a pap smear at our future appointments. It is very important for cervical cancer screening     ??????????????????????????????? ???????????????????????????? [facility] ????????????????????????????????????   ?????????????????????  ??????????? ???????????????????????????????????? ???????????????????????????????????????????????????? ??????????????????????????????? ????????? ? ?????????????????????????????? ????????????????????????  Pap Smear: ??????????????????????????????? pap smear ????????????? ??????????????????????????????????????????????????

## 2019-06-03 NOTE — Progress Notes (Signed)
   CC: Elevated cholesterol  HPI:  [name] is a 54 y.o. with a PMHx of BPPV, hyperlipidemia who presents to the clinic for elevated cholesterol and to meet new PCP.   Please see the Encounters tab for problem-based Assessment & Plan regarding status of patient's chronic conditions.  Past Medical History:  Diagnosis Date  . GERD (gastroesophageal reflux disease)   . Hx of adenomatous polyp of colon 02/26/2019  . Hyperlipidemia    yes per pt   Review of Systems: Review of Systems  Constitutional: Negative for chills and fever.  HENT: Negative for tinnitus.   Respiratory: Negative for cough and shortness of breath.   Cardiovascular: Negative for chest pain and palpitations.  Gastrointestinal: Negative for abdominal pain, diarrhea, nausea and vomiting.  Neurological: Positive for dizziness (improving).    Physical Exam:  There were no vitals filed for this visit.  Physical Exam Vitals and nursing note reviewed.  Constitutional:      General: She is not in acute distress.    Appearance: She is normal weight.  HENT:     Head: Normocephalic and atraumatic.  Cardiovascular:     Rate and Rhythm: Normal rate and regular rhythm.     Heart sounds: No murmur.  Pulmonary:     Effort: Pulmonary effort is normal. No respiratory distress.     Breath sounds: Normal breath sounds. No wheezing or rales.  Abdominal:     General: Bowel sounds are normal. There is no distension.     Palpations: Abdomen is soft.     Tenderness: There is no abdominal tenderness. There is no guarding.  Musculoskeletal:     Right lower leg: No edema.     Left lower leg: No edema.  Skin:    General: Skin is warm and dry.  Neurological:     General: No focal deficit present.     Mental Status: She is alert and oriented to person, place, and time. Mental status is at baseline.  Psychiatric:        Mood and Affect: Mood normal.        Behavior: Behavior normal.    Assessment & Plan:   See  Encounters Tab for problem based charting.  Patient discussed with Dr. He[facility]

## 2019-06-03 NOTE — Assessment & Plan Note (Signed)
Previous lipid panel obtained at family medicine office in January 2021 showed total cholesterol of 254 and LDL of 180. Patient was non-fasting at that time, so reliability of results were questions. Using that visits vital signs and lipid panel results, patient's ASCVD risk is still only 1.5%. For primary prevention only with no history of HTN, DM, obesity, smoking, etc - Julia Watts would unlikely benefit from starting a statin at this time.   Plan:  - Fasting lipid panel - a1c - Continue to monitor yearly

## 2019-06-03 NOTE — Assessment & Plan Note (Addendum)
At last visit, patient was given information on Epley maneuver, as well as referral to vestibular PT.   Today, she reports symptoms are improving gradually and she feels comfortable managing symptoms at home. She uses Meclizine and Zofran sparingly. Will continue to monitor.   Plan:  -  Continue Meclizine and Zofran PRN

## 2019-06-05 ENCOUNTER — Other Ambulatory Visit (INDEPENDENT_AMBULATORY_CARE_PROVIDER_SITE_OTHER): Payer: 59

## 2019-06-05 DIAGNOSIS — Z0000 Encounter for general adult medical examination without abnormal findings: Secondary | ICD-10-CM

## 2019-06-05 DIAGNOSIS — E782 Mixed hyperlipidemia: Secondary | ICD-10-CM | POA: Diagnosis not present

## 2019-06-05 NOTE — Assessment & Plan Note (Signed)
Declined pap smear today

## 2019-06-06 LAB — LIPID PANEL
Chol/HDL Ratio: 4.9 ratio — ABNORMAL HIGH (ref 0.0–4.4)
Cholesterol, Total: 252 mg/dL — ABNORMAL HIGH (ref 100–199)
HDL: 51 mg/dL (ref >39)
LDL Chol Calc (NIH): 164 mg/dL — ABNORMAL HIGH (ref 0–99)
Triglycerides: 203 mg/dL — ABNORMAL HIGH (ref 0–149)
VLDL Cholesterol Cal: 37 mg/dL (ref 5–40)

## 2019-06-06 LAB — HEMOGLOBIN A1C
Est. average glucose Bld gHb Est-mCnc: 117 mg/dL
Hgb A1c MFr Bld: 5.7 % — ABNORMAL HIGH (ref 4.8–5.6)

## 2019-06-08 ENCOUNTER — Ambulatory Visit: Payer: 59 | Attending: Internal Medicine

## 2019-06-08 DIAGNOSIS — Z23 Encounter for immunization: Secondary | ICD-10-CM

## 2019-06-08 NOTE — Progress Notes (Signed)
   Covid-19 Vaccination Clinic  Name:  Angelamarie Csizmadia    MRN: [id number] DOB: [birth year]  06/08/2019  Ms. Goodhart was observed post Covid-19 immunization for 15 minutes without incident. She was provided with Vaccine Information Sheet and instruction to access the V-Safe system.   Ms. Tonini was instructed to call 911 with any severe reactions post vaccine: Marland Kitchen Difficulty breathing  . Swelling of face and throat  . A fast heartbeat  . A bad rash all over body  . Dizziness and weakness   Immunizations Administered    Name Date Dose VIS Date Route   Pfizer COVID-19 Vaccine 06/08/2019  1:33 PM 0.3 mL 03/18/2018 Intramuscular   Manufacturer: [place]   Lot: [id number]   [place]: [id number]

## 2019-06-09 NOTE — Progress Notes (Signed)
Internal Medicine Clinic Attending  Case discussed with Dr. Basaraba at the time of the visit.  We reviewed the resident's history and exam and pertinent patient test results.  I agree with the assessment, diagnosis, and plan of care documented in the resident's note.    

## 2019-06-29 ENCOUNTER — Ambulatory Visit: Payer: 59 | Attending: Internal Medicine

## 2019-06-29 DIAGNOSIS — Z23 Encounter for immunization: Secondary | ICD-10-CM

## 2019-06-29 NOTE — Progress Notes (Signed)
   Covid-19 Vaccination Clinic  Name:  Leontine Radman    MRN: [id number] DOB: Aug 31, 1965  06/29/2019  Ms. Lovern was observed post Covid-19 immunization for 15 minutes without incident. She was provided with Vaccine Information Sheet and instruction to access the V-Safe system.   Ms. Vadala was instructed to call 911 with any severe reactions post vaccine: Marland Kitchen Difficulty breathing  . Swelling of face and throat  . A fast heartbeat  . A bad rash all over body  . Dizziness and weakness   Immunizations Administered    Name Date Dose VIS Date Route   Pfizer COVID-19 Vaccine 06/29/2019  1:28 PM 0.3 mL 03/18/2018 Intramuscular   Manufacturer: [place]   Lot: [id number]   [place]: [id number]

## 2020-07-06 ENCOUNTER — Ambulatory Visit (INDEPENDENT_AMBULATORY_CARE_PROVIDER_SITE_OTHER): Payer: 59 | Admitting: Internal Medicine

## 2020-07-06 ENCOUNTER — Encounter: Payer: Self-pay | Admitting: Student

## 2020-07-06 ENCOUNTER — Other Ambulatory Visit (HOSPITAL_COMMUNITY)
Admission: RE | Admit: 2020-07-06 | Discharge: 2020-07-06 | Disposition: A | Discharge status: Home / Self Care | Payer: 59 | Source: Ambulatory Visit | Attending: Student in an Organized Health Care Education/Training Program | Admitting: Student in an Organized Health Care Education/Training Program

## 2020-07-06 VITALS — BP 118/78 | HR 60 | Temp 98.6°F | Wt 134.6 lb

## 2020-07-06 DIAGNOSIS — N9489 Other specified conditions associated with female genital organs and menstrual cycle: Secondary | ICD-10-CM | POA: Diagnosis present

## 2020-07-06 NOTE — Progress Notes (Signed)
   CC: Vaginal cramping  HPI:  [name] is a 55 y.o. with a past medical history listed below presenting for evaluation of vaginal cramping for the past month. For details of today's visit and the status of his chronic medical issues please refer to the assessment and plan.   Past Medical History:  Diagnosis Date   GERD (gastroesophageal reflux disease)    Hx of adenomatous polyp of colon 02/26/2019   Hyperlipidemia    yes per pt   Review of Systems:   Review of Systems  Constitutional:  Negative for chills, fever and malaise/fatigue.  Gastrointestinal:  Negative for abdominal pain, blood in stool, constipation, diarrhea, nausea and vomiting.  Genitourinary:  Negative for dysuria, flank pain, frequency, hematuria and urgency.  Musculoskeletal:  Negative for myalgias.    Physical Exam:  Vitals:   07/06/20 1019  BP: 118/78  Pulse: 60  Temp: 98.6 F (37 C)  TempSrc: Oral  SpO2: 98%  Weight: 134 lb 9.6 oz (61.1 kg)   Physical Exam Constitutional:      General: She is not in acute distress.    Appearance: Normal appearance. She is not ill-appearing.  Cardiovascular:     Rate and Rhythm: Normal rate and regular rhythm.     Heart sounds: Normal heart sounds. No murmur heard.   No friction rub. No gallop.  Pulmonary:     Effort: Pulmonary effort is normal. No respiratory distress.     Breath sounds: Normal breath sounds. No wheezing or rales.  Abdominal:     General: Abdomen is flat. Bowel sounds are normal. There is no distension.     Palpations: Abdomen is soft.     Tenderness: There is no abdominal tenderness. There is no guarding.  Genitourinary:    General: Normal vulva.     Pubic Area: No rash.      Vagina: No vaginal discharge.     Comments: Vaginal canal erythematous, inflamed appearing.  No vaginal discharge or fluid appreciated.  As speculum was being removed it appeared as if there was a vaginal prolapse or irregularly shaped skin fold.  No cervical  motion tenderness or tenderness on examination Musculoskeletal:     Right lower leg: No edema.     Left lower leg: No edema.  Skin:    General: Skin is warm and dry.  Neurological:     Mental Status: She is alert and oriented to person, place, and time.  Psychiatric:        Mood and Affect: Mood normal.        Behavior: Behavior normal.        Thought Content: Thought content normal.     Assessment & Plan:   See Encounters Tab for problem based charting.  Patient discussed with Dr. Evette Doffing

## 2020-07-06 NOTE — Progress Notes (Signed)
Internal Medicine Clinic Attending ° °Case discussed with Dr. Rehman  At the time of the visit.  We reviewed the resident’s history and exam and pertinent patient test results.  I agree with the assessment, diagnosis, and plan of care documented in the resident’s note.  ° °

## 2020-07-06 NOTE — Assessment & Plan Note (Addendum)
Patient presents today for evaluation of vaginal cramping that has been ongoing for 1 month.  She states that the pain is along her vagina and most prominent at nighttime when she is resting.  She states that the pain is a 1 out of 10 and feels like something is "sucking inside of her vagina".  She denies any vaginal bleeding or discharge.  States that she is in menopause.  She is not currently sexually active.  She has never been tested for any sexually transmitted diseases.  Denies any abdominal pain, nausea, vomiting, fevers, chills, dysuria, urgency or frequency.  States that she feels constipated but does have a bowel movement daily.  Denies any blood in her urine or stool.  Denies any sensation of prolapse.  She has given vaginal birth x2.  No history of C-sections.  She has never had a Pap smear before.  On pelvic exam, the vaginal canal appeared erythematous and inflamed.  No discharge or abnormal fluid appreciated.  As a speculum was being removed it appeared as if the vaginal wall was prolapsing versus an irregularly shaped masslike protrusion.  Assessment/plan: Given patient's history and examination I question if there is a vaginal wall prolapse versus uterine prolapse.  Low suspicion for infection as there was no discharge or fluid appreciated on examination.  Provided patient with information on Kegel exercises and will refer to gynecology.  Pap smear was also completed.  -Follow-up Pap smear results and urine studies -Referral to gynecology -Kegel exercises

## 2020-07-06 NOTE — Patient Instructions (Signed)
Kegel Exercises  Kegel exercises can help strengthen your pelvic floor muscles. The pelvic floor is a group of muscles that support your rectum, small intestine, and bladder. In females, pelvic floor muscles also help support the womb (uterus). These muscles help you control the flow of urine and stool. Kegel exercises are painless and simple, and they do not require any equipment. Your provider may suggest Kegel exercises to: Improve bladder and bowel control. Improve sexual response. Improve weak pelvic floor muscles after surgery to remove the uterus (hysterectomy) or pregnancy (females). Improve weak pelvic floor muscles after prostate gland removal or surgery (males). Kegel exercises involve squeezing your pelvic floor muscles, which are the same muscles you squeeze when you try to stop the flow of urine or keep from passing gas. The exercises can be done while sitting, standing, or lying down, but itis best to vary your position. Exercises How to do Kegel exercises: Squeeze your pelvic floor muscles tight. You should feel a tight lift in your rectal area. If you are a female, you should also feel a tightness in your vaginal area. Keep your stomach, buttocks, and legs relaxed. Hold the muscles tight for up to 10 seconds. Breathe normally. Relax your muscles. Repeat as told by your health care provider. Repeat this exercise daily as told by your health care provider. Continue to do this exercise for at least 4-6 weeks, or for as long as told by your healthcare provider. You may be referred to a physical therapist who can help you learn more abouthow to do Kegel exercises. Depending on your condition, your health care provider may recommend: Varying how long you squeeze your muscles. Doing several sets of exercises every day. Doing exercises for several weeks. Making Kegel exercises a part of your regular exercise routine. This information is not intended to replace advice given to you by  your health care provider. Make sure you discuss any questions you have with your healthcare provider. Document Revised: 12/30/2019 Document Reviewed: 08/28/2017 Elsevier Patient Education  2022 Elsevier Inc.  

## 2020-07-08 LAB — CYTOLOGY - PAP
Comment: NEGATIVE
Comment: NEGATIVE
Diagnosis: NEGATIVE
High risk HPV: NEGATIVE
Trichomonas: NEGATIVE

## 2020-07-08 LAB — URINE CYTOLOGY ANCILLARY ONLY
Bacterial Vaginitis-Urine: NEGATIVE
Chlamydia: NEGATIVE
Comment: NEGATIVE
Comment: NORMAL
Neisseria Gonorrhea: NEGATIVE

## 2020-07-26 ENCOUNTER — Encounter: Payer: Self-pay | Admitting: *Deleted

## 2020-08-14 ENCOUNTER — Encounter (HOSPITAL_COMMUNITY): Payer: Self-pay | Admitting: *Deleted

## 2020-08-14 ENCOUNTER — Other Ambulatory Visit: Payer: Self-pay

## 2020-08-14 ENCOUNTER — Ambulatory Visit (HOSPITAL_COMMUNITY)
Admission: EM | Admit: 2020-08-14 | Discharge: 2020-08-14 | Disposition: A | Discharge status: Home / Self Care | Payer: 59 | Attending: Medical Oncology | Admitting: Medical Oncology

## 2020-08-14 DIAGNOSIS — R202 Paresthesia of skin: Secondary | ICD-10-CM | POA: Insufficient documentation

## 2020-08-14 LAB — BASIC METABOLIC PANEL
Anion gap: 6 (ref 5–15)
BUN: 10 mg/dL (ref 6–20)
CO2: 27 mmol/L (ref 22–32)
Calcium: 9.3 mg/dL (ref 8.9–10.3)
Chloride: 105 mmol/L (ref 98–111)
Creatinine, Ser: 0.69 mg/dL (ref 0.44–1.00)
GFR, Estimated: >60 mL/min (ref >60)
Glucose, Bld: 92 mg/dL (ref 70–99)
Potassium: 4.8 mmol/L (ref 3.5–5.1)
Sodium: 138 mmol/L (ref 135–145)

## 2020-08-14 LAB — CBC
HCT: 41 % (ref 36.0–46.0)
Hemoglobin: 13.4 g/dL (ref 12.0–15.0)
MCH: 29.3 pg (ref 26.0–34.0)
MCHC: 32.7 g/dL (ref 30.0–36.0)
MCV: 89.5 fL (ref 80.0–100.0)
Platelets: 281 10*3/uL (ref 150–400)
RBC: 4.58 MIL/uL (ref 3.87–5.11)
RDW: 13.3 % (ref 11.5–15.5)
WBC: 6 10*3/uL (ref 4.0–10.5)
nRBC: 0 % (ref 0.0–0.2)

## 2020-08-14 NOTE — ED Triage Notes (Signed)
Pt presents with tingling to entire  head ,face. Pt also reports same to Rt small toe. Sx's for 10 days.

## 2020-08-14 NOTE — ED Provider Notes (Addendum)
[facility]    CSN: [id number] Arrival date & time: 08/14/20  1019      History   Chief Complaint Chief Complaint  Patient presents with   Tingling   face tingling   tingling to RT small toe    Julia Watts is a 55 y.o. female.  Patient presents with her son who she wants to translate for her.  HPI   Tingling: Patient states that she has had tingling of her entire face and scalp for the past 10 days.  She also has some of similar symptoms to her right small toe.  No injury.  She denies any headache, fever, visual changes, numbness, weakness. She denies any new medications, products. No special diet or supplementations. No recent blood work within 3-6 months. They have not tried anything for symptoms.    Past Medical History:  Diagnosis Date   GERD (gastroesophageal reflux disease)    Hx of adenomatous polyp of colon 02/26/2019   Hyperlipidemia    yes per pt    Patient Active Problem List   Diagnosis Date Noted   Vaginal cramping 07/06/2020   Functional dyspepsia 06/03/2019   Hyperlipidemia 06/03/2019   BPPV (benign paroxysmal positional vertigo) 04/13/2019   Preventative health care 04/13/2019   Hx of adenomatous polyp of colon 02/26/2019    Past Surgical History:  Procedure Laterality Date   TUBAL LIGATION      OB History     Gravida  0   Para  0   Term  0   Preterm  0   AB  0   Living         SAB  0   IAB  0   Ectopic  0   Multiple      Live Births               Home Medications    Prior to Admission medications   Medication Sig Start Date End Date Taking? Authorizing Provider  meclizine (ANTIVERT) 50 MG tablet Take 0.5 tablets (25 mg total) by mouth 3 (three) times daily as needed for dizziness or nausea. 04/05/19   Darr, Edison Nasuti, PA-C  ondansetron (ZOFRAN) 4 MG tablet Take 1 tablet (4 mg total) by mouth every 6 (six) hours. 04/05/19   Darr, Edison Nasuti, PA-C  amitriptyline (ELAVIL) 25 MG tablet Take 1 tablet (25 mg total) by  mouth at bedtime. Patient not taking: Reported on 04/05/2019 02/26/19 04/05/19  Levin Erp, PA    Family History Family History  Problem Relation Age of Onset   Diabetes Mother    Heart disease Mother    Lung cancer Mother    Diabetes Father    Heart disease Father    Colon cancer Neg Hx    Esophageal cancer Neg Hx    Rectal cancer Neg Hx    Stomach cancer Neg Hx     Social History Social History   Tobacco Use   Smoking status: Never   Smokeless tobacco: Never  Vaping Use   Vaping Use: Never used  Substance Use Topics   Alcohol use: No   Drug use: No     Allergies   Patient has no known allergies.   Review of Systems Review of Systems  As stated above in HPI Physical Exam Triage Vital Signs ED Triage Vitals  Enc Vitals Group     BP 08/14/20 1143 120/87     Pulse Rate 08/14/20 1143 63     Resp  08/14/20 1143 18     Temp 08/14/20 1143 98.1 F (36.7 C)     Temp src --      SpO2 08/14/20 1143 100 %     Weight --      Height --      Head Circumference --      Peak Flow --      Pain Score 08/14/20 1144 5     Pain Loc --      Pain Edu? --      Excl. in [place]? --    No data found.  Updated Vital Signs BP 120/87   Pulse 63   Temp 98.1 F (36.7 C)   Resp 18   SpO2 100%   Physical Exam Vitals and nursing note reviewed.  Constitutional:      General: She is not in acute distress.    Appearance: Normal appearance. She is not ill-appearing, toxic-appearing or diaphoretic.  HENT:     Head: Normocephalic and atraumatic.     Nose: Nose normal.     Mouth/Throat:     Mouth: Mucous membranes are moist.  Eyes:     Comments: Some pallor of conjunctiva bilaterally   Cardiovascular:     Rate and Rhythm: Normal rate and regular rhythm.     Pulses: Normal pulses.     Heart sounds: Normal heart sounds.  Pulmonary:     Effort: Pulmonary effort is normal.     Breath sounds: Normal breath sounds.  Musculoskeletal:     Cervical back: Normal range of  motion and neck supple. No rigidity or tenderness.  Lymphadenopathy:     Cervical: No cervical adenopathy.  Skin:    General: Skin is warm.     Coloration: Skin is not jaundiced or pale.     Findings: No bruising, erythema or rash.  Neurological:     General: No focal deficit present.     Mental Status: She is alert and oriented to person, place, and time.     Cranial Nerves: No cranial nerve deficit.     Sensory: No sensory deficit.     Motor: No weakness.     Coordination: Coordination normal.     Gait: Gait normal.     Deep Tendon Reflexes: Reflexes normal.  Psychiatric:        Mood and Affect: Mood normal.        Behavior: Behavior normal.     UC Treatments / Results  Labs (all labs ordered are listed, but only abnormal results are displayed) Labs Reviewed - No data to display  EKG   Radiology No results found.  Procedures Procedures (including critical care time)  Medications Ordered in UC Medications - No data to display  Initial Impression / Assessment and Plan / UC Course  I have reviewed the triage vital signs and the nursing notes.  Pertinent labs & imaging results that were available during my care of the patient were reviewed by me and considered in my medical decision making (see chart for details).     New.  Wide differential which I discussed with patient and her son.  I have recommended lab work for basic labs today and follow-up with her primary care provider as there are additional lab work that may need to be completed if all of my labs are within normal limits.  At the meantime there is not much that we need to do as she is not having any tenderness or weakness.  Should symptoms continue  she may want to consider seeing a neurologist in the future.  We also reviewed red flag signs and symptoms.  Final Clinical Impressions(s) / UC Diagnoses   Final diagnoses:  None   Discharge Instructions   None    ED Prescriptions   None    PDMP not  reviewed this encounter.   Hughie Closs, PA-C 08/14/20 1249    Hughie Closs, PA-C 08/14/20 1315

## 2020-08-15 LAB — RPR: RPR Ser Ql: NONREACTIVE

## 2020-09-14 ENCOUNTER — Encounter: Payer: 59 | Admitting: Family Medicine

## 2020-09-23 ENCOUNTER — Encounter: Payer: Self-pay | Admitting: Family

## 2020-09-23 ENCOUNTER — Ambulatory Visit (INDEPENDENT_AMBULATORY_CARE_PROVIDER_SITE_OTHER): Payer: 59 | Admitting: Family

## 2020-09-23 ENCOUNTER — Other Ambulatory Visit: Payer: Self-pay

## 2020-09-23 VITALS — BP 114/78 | HR 67 | Wt 132.8 lb

## 2020-09-23 DIAGNOSIS — Z1231 Encounter for screening mammogram for malignant neoplasm of breast: Secondary | ICD-10-CM | POA: Diagnosis not present

## 2020-09-23 DIAGNOSIS — N819 Female genital prolapse, unspecified: Secondary | ICD-10-CM | POA: Diagnosis not present

## 2020-09-23 NOTE — Progress Notes (Signed)
Patient reports that she is here because she's been having "ovary discomfort" she stated that it feels like "something is stuck in there". [name] described the pain as "pelvic cramps"   Julia Watts, [place]   09/23/20

## 2020-09-23 NOTE — Progress Notes (Addendum)
History:  Ms. Julia Watts is a 54 y.o. who presents to clinic today for follow-up for vaginal pain seen at internal medicine.  Exam showed a prolapse within the vaginal canal.  Patient reports feeling a presence in the vaginal area.  Denies incontinence.  Patient reports that the pain is no longer present, however still feels the presence in the vagina.    Menopausal x 6 years.      The following portions of the patient's history were reviewed and updated as appropriate: allergies, current medications, family history, past medical history, social history, past surgical history and problem list.  Review of Systems:  Review of Systems  Constitutional:  Negative for appetite change, fatigue and unexpected weight change.  Gastrointestinal:  Negative for abdominal distention and nausea.  Genitourinary:  Negative for difficulty urinating, menstrual problem, pelvic pain and urgency.  +vaginal sensation/pressure.    Objective:  Physical Exam BP 114/78   Pulse 67   Wt 132 lb 12.8 oz (60.2 kg)   BMI 25.09 kg/m  Physical Exam Constitutional:      General: She is not in acute distress.    Appearance: She is well-developed.  HENT:     Head: Normocephalic.  Cardiovascular:     Rate and Rhythm: Normal rate and regular rhythm.     Heart sounds: Normal heart sounds.  Pulmonary:     Effort: Pulmonary effort is normal.     Breath sounds: Normal breath sounds.  Abdominal:     Palpations: Abdomen is soft.     Tenderness: There is no abdominal tenderness.  Musculoskeletal:     Cervical back: Normal range of motion and neck supple.  Skin:    General: Skin is warm and dry.  Neurological:     Mental Status: She is alert and oriented to person, place, and time.     Labs and Imaging No results found for this or any previous visit (from the past 24 hour(s)).  No results found.   Assessment & Plan:  1. Screening mammogram for breast cancer - MM 3D SCREEN BREAST BILATERAL; Future  2.   Pelvic Prolapse - Referred to urogynecololgy for evaluation  3.  Follow-up - Reviewed pap smear and vaginal swab results  Total time for visit:  43 minutes; extended time need due to needed interpreter and coordination of services due to language barrier (scheduling of mammogram and referral to urogynecology prior to discharge with appointment given before leaving)   Cyndee Brightly, CNM 09/23/2020 9:52 AM

## 2020-11-03 ENCOUNTER — Ambulatory Visit: Payer: 59

## 2020-11-07 NOTE — Progress Notes (Deleted)
[facility]ne[facility] New Patient Evaluation and Consultation  Referring Provider: Carrolyn Leigh * PCP: Jose Persia, MD Date of Service: 11/08/2020  SUBJECTIVE Chief Complaint: No chief complaint on file.  History of Present Illness: Julia Watts is a 55 y.o. Asian female seen in consultation at the request of Dr. Ainsley Spinner for evaluation of prolapse.    Review of records significant for: Patient has vaginal pain and cramping. Pain resolved and left with feeling of pressure in the vagina. Prolapse noted on exam.   Urinary Symptoms: {urine leakage?:24754} Leaks *** time(s) per {days/wks/mos/yrs:310907}.  Pad use: {NUMBERS 1-10:18281} {pad option:24752} per day.   She {ACTION; IS/IS LZJ:[id number]} bothered by her UI symptoms.  Day time voids ***.  Nocturia: *** times per night to void. Voiding dysfunction: she {empties:24755} her bladder well.  {DOES NOT does:27190::"does not"} use a catheter to empty bladder.  When urinating, she feels {urine symptoms:24756} Drinks: *** per day  UTIs: {NUMBERS 1-10:18281} UTI's in the last year.   {ACTIONS;DENIES/REPORTS:21021675::"Denies"} history of {urologic concerns:24757}  Pelvic Organ Prolapse Symptoms:                  She {denies/ admits to:24761} a feeling of a bulge the vaginal area. It has been present for {NUMBER 1-10:22536} {days/wks/mos/yrs:310907}.  She {denies/ admits to:24761} seeing a bulge.  This bulge {ACTION; IS/IS TKW:40973532} bothersome.  Bowel Symptom: Bowel movements: *** time(s) per {Time; day/week/month:13537} Stool consistency: {stool consistency:24758} Straining: {yes/no:19897}.  Splinting: {yes/no:19897}.  Incomplete evacuation: {yes/no:19897}.  She {denies/ admits to:24761} accidental bowel leakage / fecal incontinence  Occurs: *** time(s) per {Time; day/week/month:13537}  Consistency with leakage: {stool consistency:24758} Bowel regimen: {bowel regimen:24759} Last colonoscopy: Date  ***, Results ***  Sexual Function Sexually active: {yes/no:19897}.  Sexual orientation: {Sexual [phone]} Pain with sex: {pain with sex:24762}  Pelvic Pain {denies/ admits to:24761} pelvic pain Location: *** Pain occurs: *** Prior pain treatment: *** Improved by: *** Worsened by: ***   Past Medical History:  Past Medical History:  Diagnosis Date   GERD (gastroesophageal reflux disease)    Hx of adenomatous polyp of colon 02/26/2019   Hyperlipidemia    yes per pt     Past Surgical History:   Past Surgical History:  Procedure Laterality Date   TUBAL LIGATION       Past OB/GYN History: G{NUMBERS 1-10:18281} P{NUMBERS 1-10:18281} Vaginal deliveries: ***,  Forceps/ Vacuum deliveries: ***, Cesarean section: *** Menopausal: {menopausal:24763} Contraception: ***. Last pap smear was ***.  Any history of abnormal pap smears: {yes/no:19897}.   Medications: She has a current medication list which includes the following prescription(s): meclizine, ondansetron, and [DISCONTINUED] amitriptyline.   Allergies: Patient has No Known Allergies.   Social History:  Social History   Tobacco Use   Smoking status: Never   Smokeless tobacco: Never  Vaping Use   Vaping Use: Never used  Substance Use Topics   Alcohol use: No   Drug use: No    Relationship status: {relationship status:24764} She lives with ***.   She {ACTION; IS/IS DJM:42683419} employed ***. Regular exercise: {Yes/No:304960894} History of abuse: {Yes/No:304960894}  Family History:   Family History  Problem Relation Age of Onset   Diabetes Mother    Heart disease Mother    Lung cancer Mother    Diabetes Father    Heart disease Father    Colon cancer Neg Hx    Esophageal cancer Neg Hx    Rectal cancer Neg Hx    Stomach cancer Neg Hx  Review of Systems: ROS   OBJECTIVE Physical Exam: There were no vitals filed for this visit.  Physical Exam   GU / Detailed Urogynecologic  Evaluation:  Pelvic Exam: Normal external female genitalia; Bartholin's and Skene's glands normal in appearance; urethral meatus normal in appearance, no urethral masses or discharge.   CST: {gen negative/positive:315881}  Reflexes: bulbocavernosis {DESC; PRESENT/NOT PRESENT:21021351}, anocutaneous {DESC; PRESENT/NOT PRESENT:21021351} ***bilaterally.  Speculum exam reveals normal vaginal mucosa {With/Without:20273} atrophy. Cervix {exam; gyn cervix:30847}. Uterus {exam; pelvic uterus:30849}. Adnexa {exam; adnexa:12223}.    s/p hysterectomy: Speculum exam reveals normal vaginal mucosa {With/Without:20273}  atrophy and normal vaginal cuff.  Adnexa {exam; adnexa:12223}.    With apex supported, anterior compartment defect was {reduced:24765}  Pelvic floor strength {Roman # I-V:19040}/V, puborectalis {Roman # I-V:19040}/V external anal sphincter {Roman # I-V:19040}/V  Pelvic floor musculature: Right levator {Tender/Non-tender:20250}, Right obturator {Tender/Non-tender:20250}, Left levator {Tender/Non-tender:20250}, Left obturator {Tender/Non-tender:20250}  POP-Q:   POP-Q                                               Aa                                               Ba                                                 C                                                Gh                                               Pb                                               tvl                                                Ap                                               Bp                                                 D     Rectal Exam:  Normal sphincter tone, {rectocele:24766} distal  rectocele, enterocoele {DESC; PRESENT/NOT PRESENT:21021351}, no rectal masses, {sign of:24767} dyssynergia when asking the patient to bear down.  Post-Void Residual (PVR) by Bladder Scan: In order to evaluate bladder emptying, we discussed obtaining a postvoid residual and she agreed to this  procedure.  Procedure: The ultrasound unit was placed on the patient's abdomen in the suprapubic region after the patient had voided. A PVR of *** ml was obtained by bladder scan.  Laboratory Results: @ENCLABS @   ***I visualized the urine specimen, noting the specimen to be {urine color:24768}  ASSESSMENT AND PLAN Ms. Hedtke is a 55 y.o. with: No diagnosis found.    Jaquita Folds, MD   Medical Decision Making:  - Reviewed/ ordered a clinical laboratory test - Reviewed/ ordered a radiologic study - Reviewed/ ordered medicine test - Decision to obtain old records - Discussion of management of or test interpretation with an external physician / other healthcare professional  - Assessment requiring independent historian - Review and summation of prior records - Independent review of image, tracing or specimen

## 2020-11-08 ENCOUNTER — Ambulatory Visit: Payer: 59 | Admitting: Obstetrics and Gynecology

## 2020-12-02 ENCOUNTER — Ambulatory Visit
Admission: RE | Admit: 2020-12-02 | Discharge: 2020-12-02 | Disposition: A | Discharge status: Home / Self Care | Payer: 59 | Source: Ambulatory Visit | Attending: Family | Admitting: Family

## 2020-12-02 DIAGNOSIS — Z1231 Encounter for screening mammogram for malignant neoplasm of breast: Secondary | ICD-10-CM

## 2022-06-19 ENCOUNTER — Other Ambulatory Visit: Payer: Self-pay

## 2022-06-19 ENCOUNTER — Encounter: Payer: Self-pay | Admitting: Student

## 2022-06-19 ENCOUNTER — Ambulatory Visit (INDEPENDENT_AMBULATORY_CARE_PROVIDER_SITE_OTHER): Payer: 59 | Admitting: Student

## 2022-06-19 VITALS — BP 111/71 | HR 64 | Temp 98.0°F | Ht 60.0 in | Wt 131.7 lb

## 2022-06-19 DIAGNOSIS — Z0000 Encounter for general adult medical examination without abnormal findings: Secondary | ICD-10-CM

## 2022-06-19 DIAGNOSIS — Z1231 Encounter for screening mammogram for malignant neoplasm of breast: Secondary | ICD-10-CM

## 2022-06-19 DIAGNOSIS — F5104 Psychophysiologic insomnia: Secondary | ICD-10-CM

## 2022-06-19 DIAGNOSIS — E782 Mixed hyperlipidemia: Secondary | ICD-10-CM

## 2022-06-19 DIAGNOSIS — R7303 Prediabetes: Secondary | ICD-10-CM

## 2022-06-19 NOTE — Patient Instructions (Addendum)
Julia Watts,  It was nice seeing you in the clinic today.   1.  For your sleep onset insomnia, please practice the sleep hygiene that we talked about which include avoid using your phone or caffeinated drinks before bed, do not go to bed until you are sleepy, and earplugs or sleeping in separate room to avoid interruptions.  2.  You can take One a Day Women 50+ multivitamin   3.  I will check your cholesterol, A1c, blood count, and kidney function today.  Please follow-up in 60-month, sooner if needed  Dr. Cyndie Chime

## 2022-06-19 NOTE — Assessment & Plan Note (Signed)
-  Check HIV and hep C -Order mammogram

## 2022-06-19 NOTE — Progress Notes (Signed)
   CC: insomnia  HPI:  Julia Watts is a 57 y.o. living with [facility], who presents to the clinic for evaluation of her chronic insomnia.  Please see problem based charting for detail  Past Medical History:  Diagnosis Date   GERD (gastroesophageal reflux disease)    Hx of adenomatous polyp of colon 02/26/2019   Hyperlipidemia    yes per pt   Review of Systems:  per HPI  Physical Exam:  Vitals:   06/19/22 1009  BP: 111/71  Pulse: 64  Temp: 98 F (36.7 C)  TempSrc: Oral  SpO2: 100%  Weight: 131 lb 11.2 oz (59.7 kg)  Height: 5' (1.524 m)   Physical Exam Constitutional:      General: She is not in acute distress.    Appearance: She is not ill-appearing.  Eyes:     General:        Right eye: No discharge.        Left eye: No discharge.     Conjunctiva/sclera: Conjunctivae normal.  Cardiovascular:     Rate and Rhythm: Normal rate and regular rhythm.  Pulmonary:     Effort: Pulmonary effort is normal. No respiratory distress.     Breath sounds: Normal breath sounds. No wheezing.  Musculoskeletal:     Cervical back: Normal range of motion.  Skin:    General: Skin is warm.  Neurological:     Mental Status: She is alert. Mental status is at baseline.  Psychiatric:        Mood and Affect: Mood normal.      Assessment & Plan:   See Encounters Tab for problem based charting.  Chronic insomnia Patient reports chronic insomnia for the last 2 to 3 years.  She has issue of initiating sleep but no issue with maintaining her sleep.  She said that if she wakes up in the middle the night, it is difficult for her to go back to sleep.  She goes to bed around 8-10 PM and does not sleep until 2 AM.  She wakes up around 9 AM in the morning.  She does use her phone while laying on bed.  She denies any coffee or soda before bed. She denies daytime somnolence or required naps during the day.  She can perform her daily cavities without any impairments.  She states that her insomnia  does not affect her quality of life or function.  Patient's spouse did not notice any snoring or abnormal breathing while she is sleeping.  She never worked her shift in the past.  She is not taking any medications including over-the-counter drugs.  She denies any restless leg syndrome symptoms.  Her PHQ-2 screening was negative.  Patient has chronic sleep onset insomnia that does not causing any functional impairment.  First-line treatment for chronic insomnia is CBT.  We also discussed sleep hygiene strategies such as avoid using electronic devices while in bed, avoid caffeinated drinks before bed or do not lay on bed until she is feeling sleepy.  Patient would like to try strategies first before considering CBT.  Pre-diabetes A1c was 5.7 in 2021.  -Recheck A1c, lipid panel, BMP and CBC  Preventative health care -Check HIV and hep C -Order mammogram   Patient discussed with Dr.  Sol Blazing

## 2022-06-19 NOTE — Assessment & Plan Note (Addendum)
Patient reports chronic insomnia for the last 2 to 3 years.  She has issue of initiating sleep but no issue with maintaining her sleep.  She said that if she wakes up in the middle the night, it is difficult for her to go back to sleep.  She goes to bed around 8-10 PM and does not sleep until 2 AM.  She wakes up around 9 AM in the morning.  She does use her phone while laying on bed.  She denies any coffee or soda before bed. She denies daytime somnolence or required naps during the day.  She can perform her daily cavities without any impairments.  She states that her insomnia does not affect her quality of life or function.  Patient's spouse did not notice any snoring or abnormal breathing while she is sleeping.  She never worked her shift in the past.  She is not taking any medications including over-the-counter drugs.  She denies any restless leg syndrome symptoms.  Her PHQ-2 screening was negative.  Patient has chronic sleep onset insomnia that does not causing any functional impairment.  First-line treatment for chronic insomnia is CBT.  We also discussed sleep hygiene strategies such as avoid using electronic devices while in bed, avoid caffeinated drinks before bed or do not lay on bed until she is feeling sleepy.  Patient would like to try strategies first before considering CBT.

## 2022-06-19 NOTE — Assessment & Plan Note (Signed)
A1c was 5.7 in 2021.  -Recheck A1c, lipid panel, BMP and CBC

## 2022-06-20 LAB — BMP8+ANION GAP
Anion Gap: 18 mmol/L (ref 10.0–18.0)
BUN/Creatinine Ratio: 14 (ref 9–23)
BUN: 11 mg/dL (ref 6–24)
CO2: 21 mmol/L (ref 20–29)
Calcium: 9.4 mg/dL (ref 8.7–10.2)
Chloride: 103 mmol/L (ref 96–106)
Creatinine, Ser: 0.8 mg/dL (ref 0.57–1.00)
Glucose: 92 mg/dL (ref 70–99)
Potassium: 4.4 mmol/L (ref 3.5–5.2)
Sodium: 142 mmol/L (ref 134–144)
eGFR: 86 mL/min/{1.73_m2} (ref >59)

## 2022-06-20 LAB — CBC
Hematocrit: 41.9 % (ref 34.0–46.6)
Hemoglobin: 13.9 g/dL (ref 11.1–15.9)
MCH: 29.4 pg (ref 26.6–33.0)
MCHC: 33.2 g/dL (ref 31.5–35.7)
MCV: 89 fL (ref 79–97)
Platelets: 285 10*3/uL (ref 150–450)
RBC: 4.73 x10E6/uL (ref 3.77–5.28)
RDW: 13.5 % (ref 11.7–15.4)
WBC: 6.4 10*3/uL (ref 3.4–10.8)

## 2022-06-20 LAB — HEMOGLOBIN A1C
Est. average glucose Bld gHb Est-mCnc: 120 mg/dL
Hgb A1c MFr Bld: 5.8 % — ABNORMAL HIGH (ref 4.8–5.6)

## 2022-06-20 LAB — LIPID PANEL
Chol/HDL Ratio: 5 ratio — ABNORMAL HIGH (ref 0.0–4.4)
Cholesterol, Total: 261 mg/dL — ABNORMAL HIGH (ref 100–199)
HDL: 52 mg/dL (ref >39)
LDL Chol Calc (NIH): 177 mg/dL — ABNORMAL HIGH (ref 0–99)
Triglycerides: 173 mg/dL — ABNORMAL HIGH (ref 0–149)
VLDL Cholesterol Cal: 32 mg/dL (ref 5–40)

## 2022-06-20 LAB — HIV ANTIBODY (ROUTINE TESTING W REFLEX): HIV Screen 4th Generation wRfx: NONREACTIVE

## 2022-06-20 NOTE — Progress Notes (Signed)
Internal Medicine Clinic Attending  Case discussed with Dr. Nguyen  At the time of the visit.  We reviewed the resident's history and exam and pertinent patient test results.  I agree with the assessment, diagnosis, and plan of care documented in the resident's note. 

## 2022-06-26 ENCOUNTER — Encounter: Payer: Self-pay | Admitting: *Deleted

## 2022-07-16 ENCOUNTER — Encounter: Payer: Self-pay | Admitting: Student

## 2023-03-15 ENCOUNTER — Ambulatory Visit: Payer: Self-pay | Admitting: Internal Medicine

## 2023-03-15 ENCOUNTER — Encounter: Payer: Self-pay | Admitting: Internal Medicine

## 2023-03-15 VITALS — BP 118/72 | HR 77 | Ht 61.0 in | Wt 129.0 lb

## 2023-03-15 DIAGNOSIS — Z860101 Personal history of adenomatous and serrated colon polyps: Secondary | ICD-10-CM | POA: Diagnosis not present

## 2023-03-15 DIAGNOSIS — K30 Functional dyspepsia: Secondary | ICD-10-CM | POA: Diagnosis not present

## 2023-03-15 DIAGNOSIS — F5104 Psychophysiologic insomnia: Secondary | ICD-10-CM | POA: Diagnosis not present

## 2023-03-15 MED ORDER — MIRTAZAPINE 7.5 MG PO TABS: 7.5000 mg | ORAL_TABLET | Freq: Every day | ORAL | 2 refills | Status: DC

## 2023-03-15 NOTE — Patient Instructions (Addendum)
 We have sent the following medications to your pharmacy for you to pick up at your convenience: Remeron  Follow-up on : 05/20/23 at 2:30 pm   _______________________________________________________  If your blood pressure at your visit was 140/90 or greater, please contact your primary care physician to follow up on this.  _______________________________________________________  If you are age 59 or older, your body mass index should be between 23-30. Your Body mass index is 24.37 kg/m. If this is out of the aforementioned range listed, please consider follow up with your Primary Care Provider.  If you are age 39 or younger, your body mass index should be between 19-25. Your Body mass index is 24.37 kg/m. If this is out of the aformentioned range listed, please consider follow up with your Primary Care Provider.   ________________________________________________________  The [facility] providers would like to encourage you to use [facility] to communicate with providers for non-urgent requests or questions.  Due to long hold times on the telephone, sending your provider a message by [facility] may be a faster and more efficient way to get a response.  Please allow 48 business hours for a response.  Please remember that this is for non-urgent requests.  _______________________________________________________  Thank you for choosing me and [facility].  Dr.Carl Leone Payor

## 2023-03-15 NOTE — Progress Notes (Signed)
   Julia Watts 57 y.o. Nov 18, 1965 [id number]  Assessment & Plan:   Encounter Diagnoses  Name Primary?   Functional dyspepsia Yes   Chronic insomnia    Hx of adenomatous polyp of colon    Persistent functional dyspepsia symptoms.  There is concomitant insomnia.  She has not responded to PPI therapy IBgard or tricyclic agent.    Given the insomnia and her GI symptoms I think mirtazapine could be helpful and we will try this.  Meds ordered this encounter  Medications   mirtazapine (REMERON) 7.5 MG tablet    Sig: Take 1 tablet (7.5 mg total) by mouth at bedtime.    Dispense:  30 tablet    Refill:  2    Return to clinic in about 2 months for reassessment.  Routine repeat colonoscopy planned 7 years after last 2028.   Subjective:   Chief Complaint: Burning abdominal pain  HPI 58 year old [place] woman here with her son, interviewed with the aid of an interpreter.  She has a history of functional dyspepsia and chronic burning abdominal discomfort that has not responded to different PPIs and when I tried amitriptyline in 2021 she did not like the way it made her feel though her abdominal symptoms improved.  She has not been seen since.  She also has a history of a 4 mm adenomatous colon polyp in 2021.  EGD in 2021 was negative.  Burning periumbilical abdominal pain is noted typically in the evening often when she is trying to go to sleep.  She also has chronic insomnia.  She had seen internal medicine clinic last spring and described insomnia but does not recall that interaction.  She was not offered pharmacologic treatment.  She moves her bowels every other day.  Sometimes there is mild bloating and she has had nausea and vomiting but not in a long time she says.  Wt Readings from Last 3 Encounters:  03/15/23 129 lb (58.5 kg)  06/19/22 131 lb 11.2 oz (59.7 kg)  09/23/20 132 lb 12.8 oz (60.2 kg)    No Known Allergies  Past Medical History:  Diagnosis Date   Chronic  insomnia    Functional dyspepsia    GERD (gastroesophageal reflux disease)    Hx of adenomatous polyp of colon 02/26/2019   Hyperlipidemia    yes per pt   Prediabetes    Past Surgical History:  Procedure Laterality Date   COLONOSCOPY WITH ESOPHAGOGASTRODUODENOSCOPY (EGD)  2021   TUBAL LIGATION     Social History   Social History Narrative      Patient is married she has 3 sons.  She is a Futures trader.  Never smoker no alcohol tobacco or drug use.  She is from [place].   family history includes Diabetes in her father and mother; Heart disease in her father and mother; Lung cancer in her mother.   Review of Systems As per HPI otherwise negative.  Objective:   Physical Exam @BP  118/72   Pulse 77   Ht 5\' 1"  (1.549 m)   Wt 129 lb (58.5 kg)   BMI 24.37 kg/m @  General:  NAD Eyes:   anicteric Lungs:  clear Heart::  S1S2 no rubs, murmurs or gallops Abdomen:  soft and nontender, BS+ Ext:   no edema, cyanosis or clubbing    Data Reviewed:  See HPI

## 2023-05-20 ENCOUNTER — Ambulatory Visit: Payer: 59 | Admitting: Internal Medicine

## 2023-05-20 VITALS — BP 102/62 | HR 63 | Ht 61.0 in | Wt 137.0 lb

## 2023-05-20 DIAGNOSIS — K30 Functional dyspepsia: Secondary | ICD-10-CM

## 2023-05-20 DIAGNOSIS — F5104 Psychophysiologic insomnia: Secondary | ICD-10-CM

## 2023-05-20 MED ORDER — MIRTAZAPINE 15 MG PO TABS: 15.0000 mg | ORAL_TABLET | Freq: Every day | ORAL | 3 refills | Status: DC

## 2023-05-20 NOTE — Progress Notes (Signed)
   Julia Watts 57 y.o. [birth year] [id number]  Assessment & Plan:   Encounter Diagnoses  Name Primary?   Functional dyspepsia Yes   Chronic insomnia    Both are improved.  Increase mirtazapine  to 15 mg at bedtime.  1 year prescription provided.  If she feels like this is inadequate treatment at 2 to 3 months she can return and we can consider increased dose.  CC: Carleen Chary, DO   Subjective:  Patient seen with the assistance of an interpreter Chief Complaint: Dyspepsia follow-up nausea burning abdominal pain  HPI 58 year old [place] woman here with her son, interviewed with the aid of an interpreter.  She has a history of functional dyspepsia and chronic burning abdominal discomfort that has not responded to different PPIs and when I tried amitriptyline  in 2021 she did not like the way it made her feel though her abdominal symptoms improved.  She has not been seen since.  She also has a history of a 4 mm adenomatous colon polyp in 2021.  EGD in 2021 was negative.   I last saw her 03/15/2023 and started mirtazapine  7.5 mg at bedtime.  With the use of pill pictures in the interpreter we determined she is taking that she reports she is actually improved.  She is sleeping better there is less nausea and burning abdominal pain and her weight is going up with better appetite.  She feels like this has been successful though still has some symptoms.   Wt Readings from Last 3 Encounters:  05/20/23 137 lb (62.1 kg)  03/15/23 129 lb (58.5 kg)  06/19/22 131 lb 11.2 oz (59.7 kg)     No Known Allergies Current Meds  Medication Sig   pantoprazole (PROTONIX) 40 MG tablet Take 40 mg by mouth daily.   Past Medical History:  Diagnosis Date   Chronic insomnia    Functional dyspepsia    GERD (gastroesophageal reflux disease)    Hx of adenomatous polyp of colon 02/26/2019   Hyperlipidemia    yes per pt   Prediabetes    Past Surgical History:  Procedure Laterality Date    COLONOSCOPY WITH ESOPHAGOGASTRODUODENOSCOPY (EGD)  2021   TUBAL LIGATION     Social History   Social History Narrative      Patient is married she has 3 sons.  She is a Futures trader.  Never smoker no alcohol tobacco or drug use.  She is from [place].   family history includes Diabetes in her father and mother; Heart disease in her father and mother; Lung cancer in her mother.   Review of Systems As per HPI  Objective:   Physical Exam BP 102/62   Pulse 63   Ht 5\' 1"  (1.549 m)   Wt 137 lb (62.1 kg)   BMI 25.89 kg/m

## 2023-05-20 NOTE — Patient Instructions (Signed)
 Please pick up the new dose of mirtazapine  and take that. I am glad it is helping.  If you do not think it is helping enough after 2 or 3 months, please come back to see us .  I appreciate the opportunity to care for you. Kenney Peacemaker, MD, [facility]

## 2023-06-02 ENCOUNTER — Other Ambulatory Visit: Payer: Self-pay | Admitting: Internal Medicine

## 2023-07-11 ENCOUNTER — Other Ambulatory Visit: Payer: Self-pay | Admitting: Internal Medicine

## 2023-10-12 IMAGING — MG MM DIGITAL SCREENING BILAT W/ TOMO AND CAD
8 series · 8 of 24 positions shown · non-contrast
Comparison: Previous exam(s).

CLINICAL DATA: Screening.

EXAM:
DIGITAL SCREENING BILATERAL MAMMOGRAM WITH TOMOSYNTHESIS AND CAD
TECHNIQUE: Bilateral screening digital craniocaudal and mediolateral oblique
mammograms were obtained. Bilateral screening digital breast
tomosynthesis was performed. The images were evaluated with
computer-aided detection.

[L CC synth-2D]
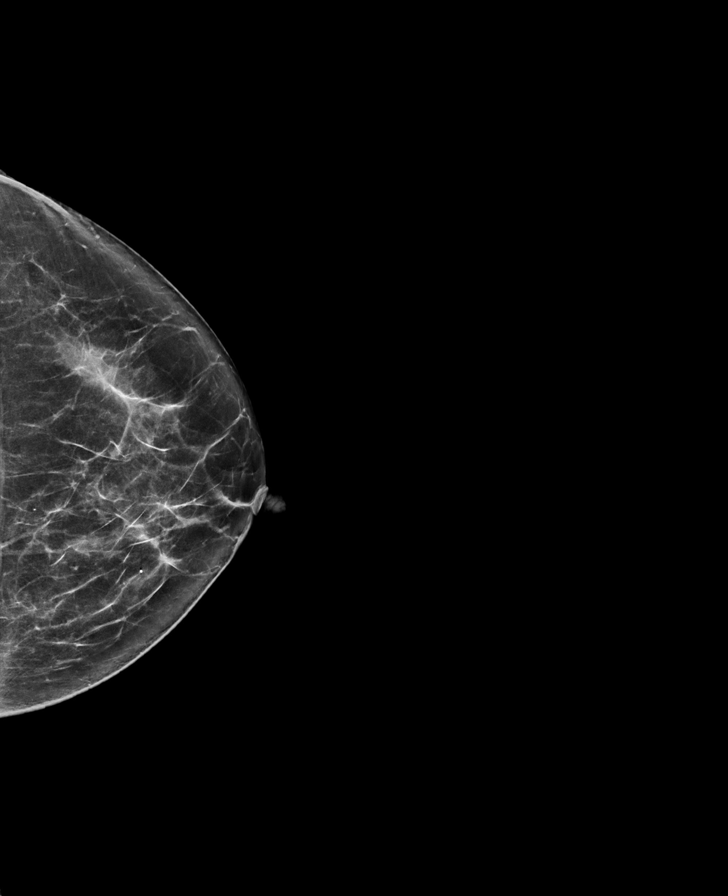

[L MLO synth-2D]
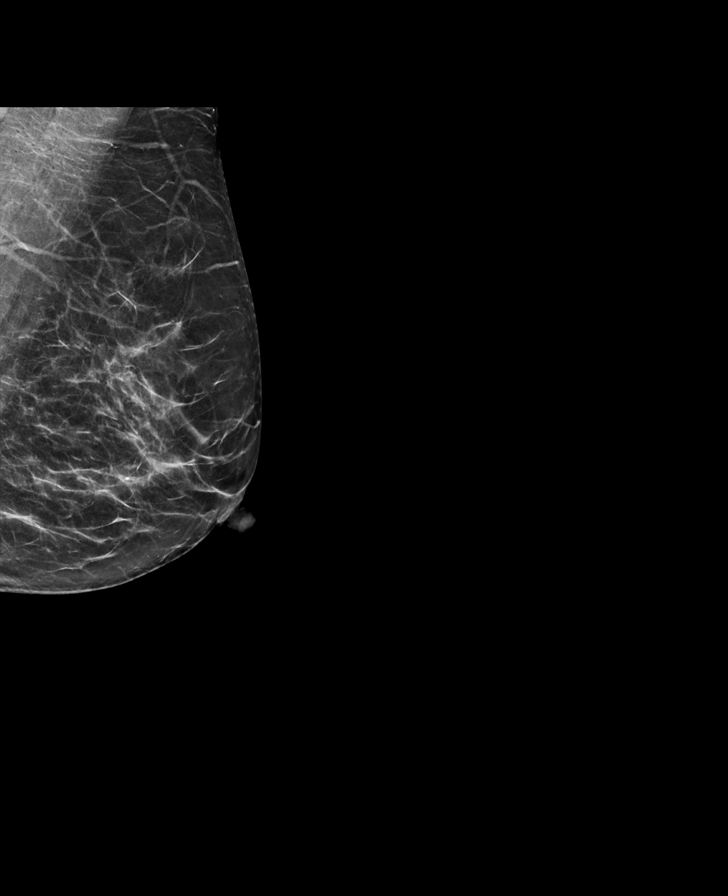

[R CC synth-2D]
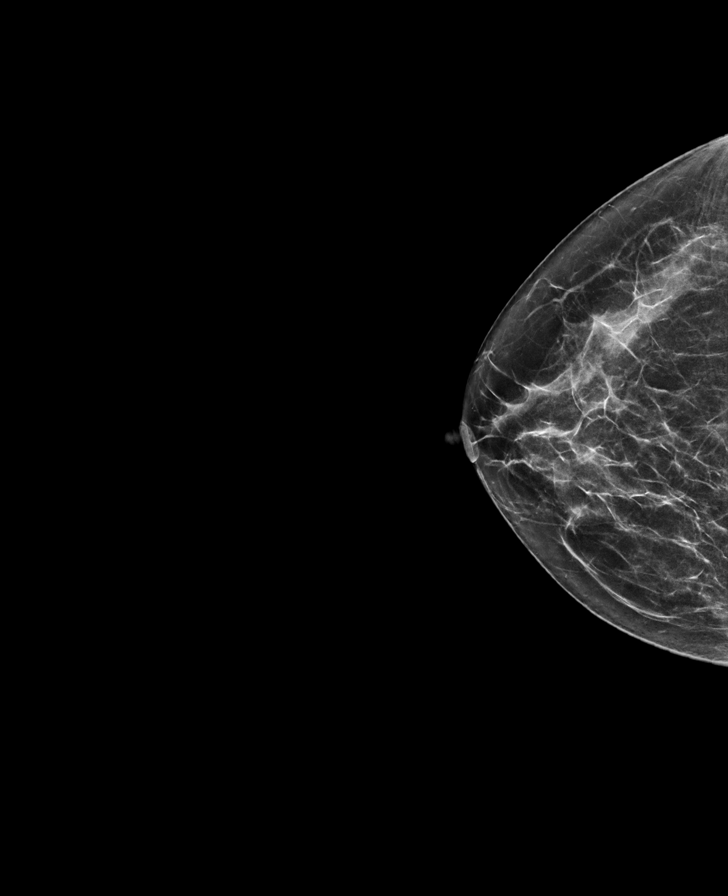

[R MLO synth-2D]
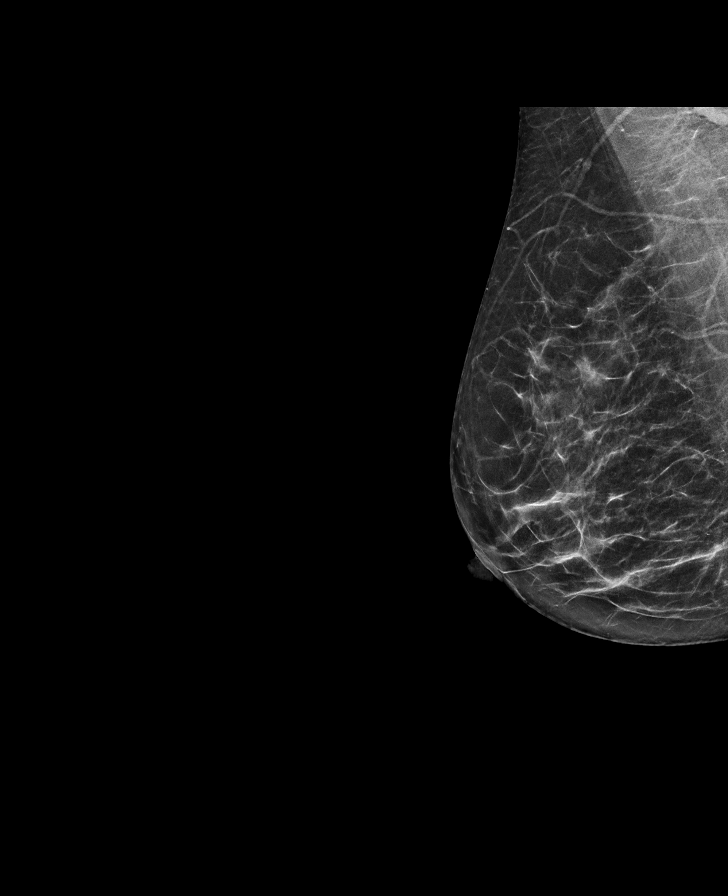

[L CC tomo · tomo slice 33/64.0]
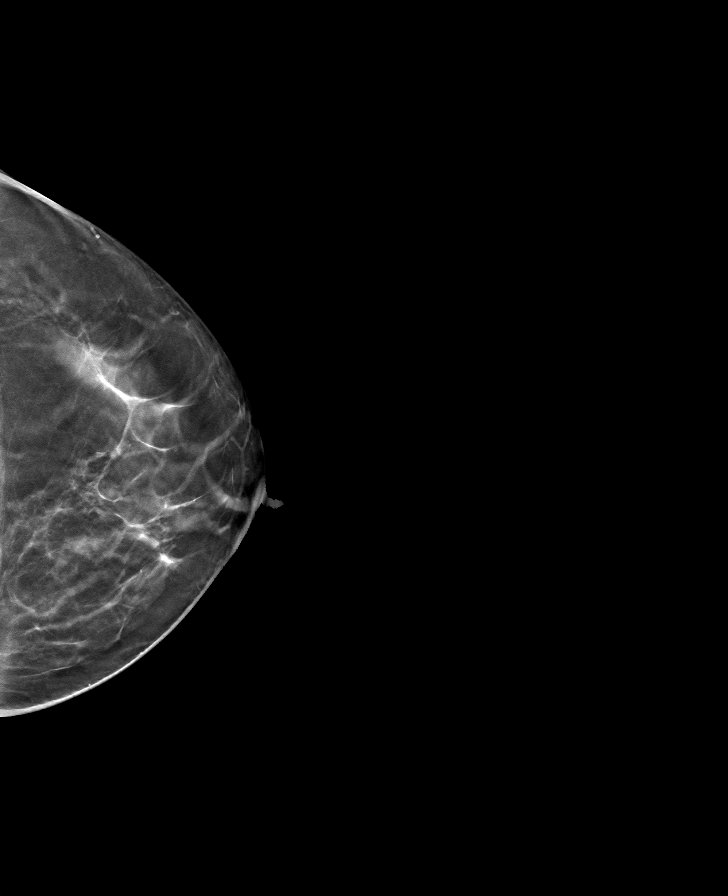

[L MLO tomo · tomo slice 29/58.0]
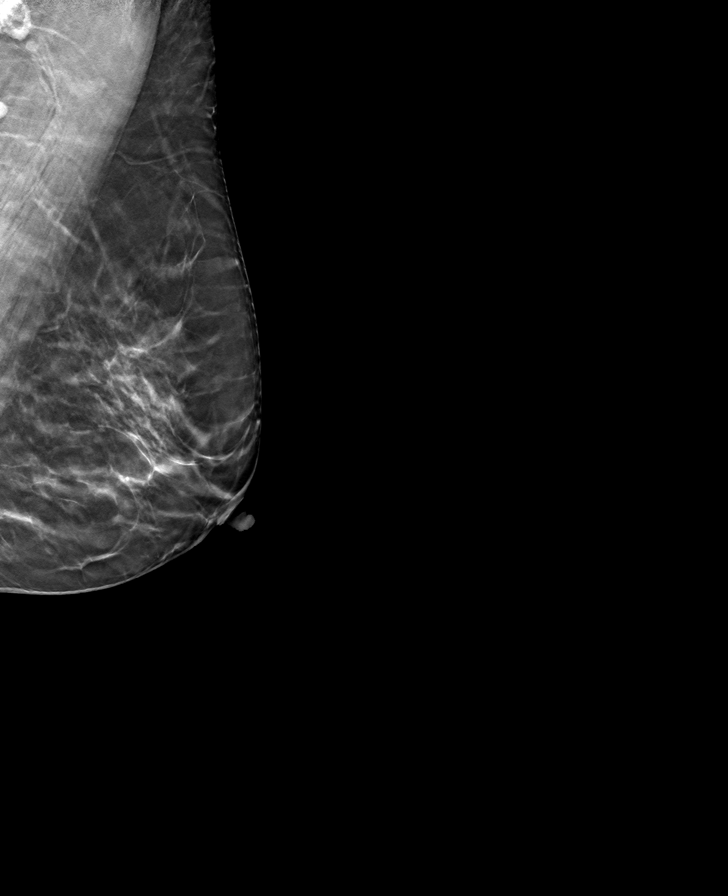

[R MLO tomo · tomo slice 29/57.0]
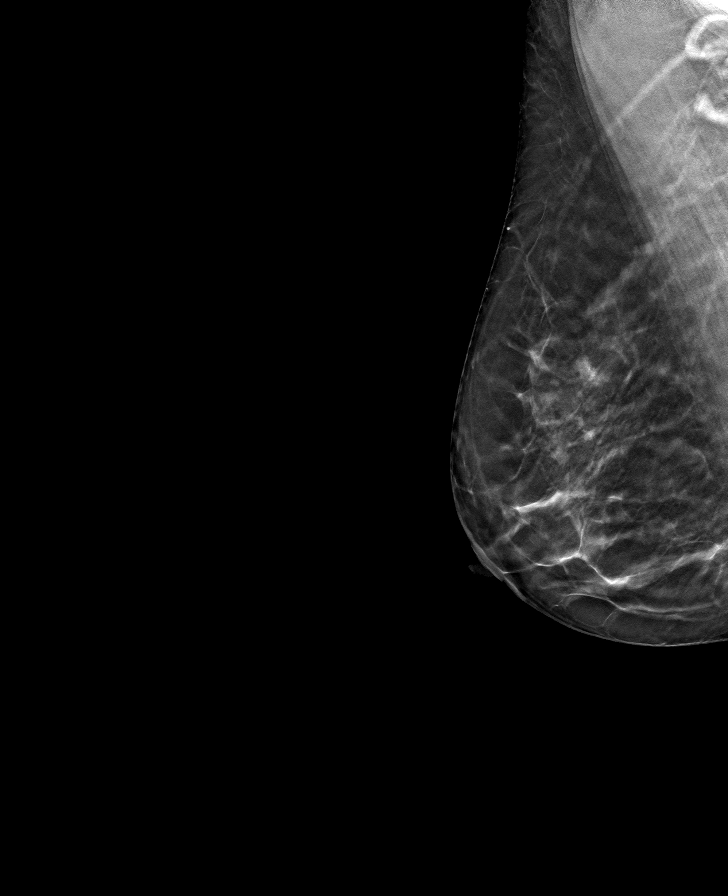

[R CC tomo · tomo slice 30/59.0]
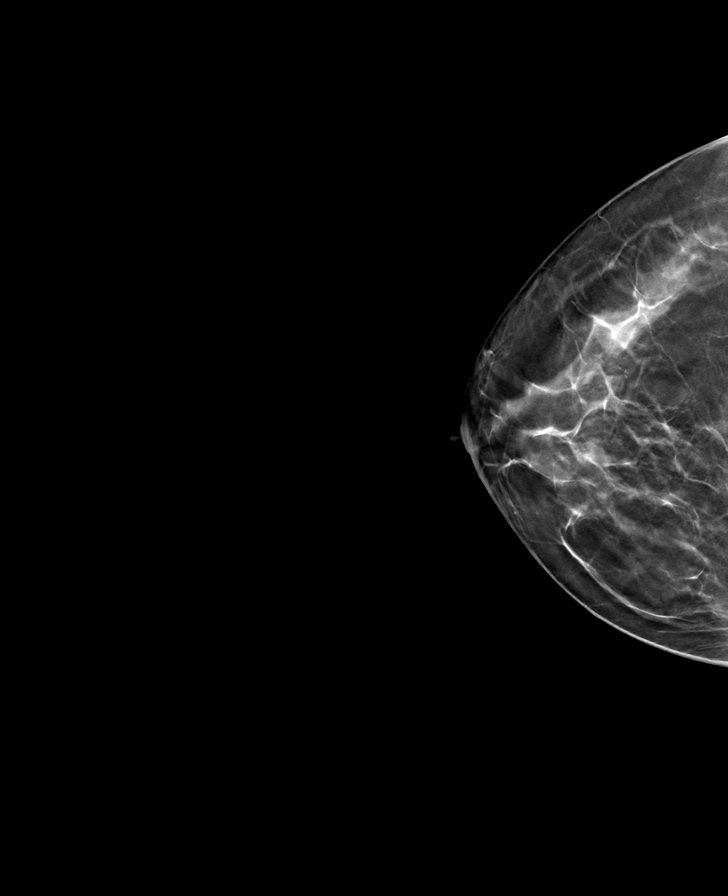

[8 of 24 positions shown; findings below may reference images not displayed]

ACR Breast Density Category c: The breast tissue is heterogeneously
dense, which may obscure small masses.
FINDINGS: There are no findings suspicious for malignancy.
IMPRESSION: No mammographic evidence of malignancy. A result letter of this
screening mammogram will be mailed directly to the patient.

RECOMMENDATION:
Screening mammogram in one year. (Code:Q3-W-BC3)

BI-RADS CATEGORY  1: Negative.

## 2023-11-04 ENCOUNTER — Other Ambulatory Visit: Payer: Self-pay | Admitting: Internal Medicine
